# Patient Record
Sex: Female | Born: 1995
Health system: Southern US, Community
[De-identification: ages and names within clinical notes are randomized; demographics above are authoritative.]

## PROBLEM LIST (undated history)

## (undated) ENCOUNTER — Inpatient Hospital Stay (HOSPITAL_COMMUNITY): Payer: Self-pay

## (undated) DIAGNOSIS — K439 Ventral hernia without obstruction or gangrene: Secondary | ICD-10-CM

## (undated) DIAGNOSIS — D649 Anemia, unspecified: Secondary | ICD-10-CM

## (undated) DIAGNOSIS — R519 Headache, unspecified: Secondary | ICD-10-CM

## (undated) DIAGNOSIS — Z975 Presence of (intrauterine) contraceptive device: Secondary | ICD-10-CM

## (undated) HISTORY — PX: NO PAST SURGERIES: SHX2092

## (undated) HISTORY — DX: Presence of (intrauterine) contraceptive device: Z97.5

## (undated) HISTORY — DX: Headache, unspecified: R51.9

---
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
## 2016-06-19 ENCOUNTER — Encounter: Payer: Self-pay | Admitting: Family

## 2016-06-19 ENCOUNTER — Ambulatory Visit (INDEPENDENT_AMBULATORY_CARE_PROVIDER_SITE_OTHER): Payer: BLUE CROSS/BLUE SHIELD | Admitting: Family

## 2016-06-19 VITALS — BP 123/60 | HR 77 | Temp 98.4°F | Resp 18 | Ht 61.0 in | Wt 188.2 lb

## 2016-06-19 DIAGNOSIS — J209 Acute bronchitis, unspecified: Secondary | ICD-10-CM | POA: Diagnosis not present

## 2016-06-19 MED ORDER — BENZONATATE 100 MG PO CAPS: 100.0000 mg | ORAL_CAPSULE | Freq: Three times a day (TID) | ORAL | 0 refills | Status: DC | PRN

## 2016-06-19 MED ORDER — AZITHROMYCIN 250 MG PO TABS: ORAL_TABLET | ORAL | 0 refills | Status: DC

## 2016-06-19 NOTE — Patient Instructions (Signed)
Begin azithromycin (antibiotic) for bronchitis. Begin tessalon 3 times daily for cough. Please call if new/worsening symptoms or if symptoms are not improved in 2-3 days.

## 2016-06-19 NOTE — Progress Notes (Signed)
Subjective:    Patient ID: Claire Schmidt, female    DOB: 1996-03-06, 20 y.o.   MRN: [id number]  HPI  Claire Schmidt is a 20 yr old female who presents today with chief complaint of cough. Cough has been present x 1 week. Had fever on the first day of 101. No further fever since day one. Cough has worsened over the course of the week. Cough is productive sometimes but mainly dray.  Nyquil/dayquil do not help. Denies ear pain or sore throat (except with cough).  Claire Schmidt does report + associated headaches.   Review of Systems See HPI  No past medical history on file.   Social History   Social History  . Marital status: Single    Spouse name: N/A  . Number of children: N/A  . Years of education: N/A   Occupational History  . Not on file.   Social History Main Topics  . Smoking status: Passive Smoke Exposure - Never Smoker  . Smokeless tobacco: Not on file     Comment: Pt states no current smokers in household  . Alcohol use No  . Drug use: No  . Sexual activity: No   Other Topics Concern  . Not on file   Social History Narrative   Lives with mom and dad.  Middle of 3 girls. 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  Family History  Problem Relation Age of Onset  . Asthma Neg Hx   . Diabetes Neg Hx   . Heart disease Neg Hx   . Hyperlipidemia Neg Hx   . Hypertension Neg Hx   . Dementia Maternal Grandmother   . Heart attack Paternal Grandmother     Allergies  Allergen Reactions  . Bee Venom     Current Outpatient Prescriptions on File Prior to Visit  Medication Sig Dispense Refill  . EPINEPHrine 0.3 mg/0.3 mL IJ SOAJ injection Inject 0.3 mLs (0.3 mg total) into the muscle once. 2 Device 0  . etonogestrel (IMPLANON) 68 MG IMPL implant 1 each (68 mg total) by Subdermal route once. 1 each 0   No current facility-administered medications on  file prior to visit.     BP 123/60 (BP Location: Right Arm, Cuff Size: Large)   Pulse 77   Temp 98.4 F (36.9 C) (Oral)   Resp 18   Ht 5\' 1"  (1.549 m)   Wt 188 lb 3.2 oz (85.4 kg)   LMP 05/08/2016   SpO2 100% Comment: room air  BMI 35.56 kg/m       Objective:   Physical Exam  Constitutional: Claire Schmidt is oriented to person, place, and time. Claire Schmidt appears well-developed and well-nourished.  HENT:  Mouth/Throat: No oropharyngeal exudate, posterior oropharyngeal edema or posterior oropharyngeal erythema.  Bilateral cerumen impaction occludes TM's  Cardiovascular: Normal rate, regular rhythm and normal heart sounds.   No murmur heard. Pulmonary/Chest: Effort normal and breath sounds normal. No respiratory distress. Claire Schmidt has no wheezes.  Lymphadenopathy:    Claire Schmidt has no cervical adenopathy.  Neurological: Claire Schmidt is alert and oriented to person, place, and time.  Skin: Skin is warm and dry.  Psychiatric: Claire Schmidt has a normal mood and affect. Her behavior is normal. Judgment and thought content normal.          Assessment & Plan:  Bronchitis-  given duration and severity of cough will initiate zpak. Tessalon as needed for cough. Pt is advised as follows:   Please call if new/worsening symptoms or if symptoms are not improved in 2-3 days.

## 2016-06-19 NOTE — Progress Notes (Signed)
Pre visit review using our clinic review tool, if applicable. No additional management support is needed unless otherwise documented below in the visit note. 

## 2016-06-21 ENCOUNTER — Encounter: Payer: Self-pay | Admitting: Family

## 2016-06-22 NOTE — Telephone Encounter (Signed)
Letter placed at front desk for pick up

## 2016-06-25 ENCOUNTER — Ambulatory Visit: Payer: BLUE CROSS/BLUE SHIELD | Admitting: Family

## 2016-06-25 ENCOUNTER — Encounter: Payer: Self-pay | Admitting: Family

## 2016-06-25 ENCOUNTER — Ambulatory Visit (HOSPITAL_BASED_OUTPATIENT_CLINIC_OR_DEPARTMENT_OTHER)
Admission: RE | Admit: 2016-06-25 | Discharge: 2016-06-25 | Disposition: A | Discharge status: Home / Self Care | Payer: BLUE CROSS/BLUE SHIELD | Source: Ambulatory Visit | Attending: Family | Admitting: Family

## 2016-06-25 ENCOUNTER — Ambulatory Visit (INDEPENDENT_AMBULATORY_CARE_PROVIDER_SITE_OTHER): Payer: BLUE CROSS/BLUE SHIELD | Admitting: Family

## 2016-06-25 VITALS — BP 112/64 | HR 66 | Temp 98.4°F | Resp 18 | Ht 61.0 in | Wt 185.8 lb

## 2016-06-25 DIAGNOSIS — R091 Pleurisy: Secondary | ICD-10-CM | POA: Diagnosis not present

## 2016-06-25 DIAGNOSIS — R059 Cough, unspecified: Secondary | ICD-10-CM

## 2016-06-25 DIAGNOSIS — R05 Cough: Secondary | ICD-10-CM | POA: Diagnosis not present

## 2016-06-25 DIAGNOSIS — J029 Acute pharyngitis, unspecified: Secondary | ICD-10-CM

## 2016-06-25 LAB — POCT RAPID STREP A (OFFICE): RAPID STREP A SCREEN: NEGATIVE

## 2016-06-25 MED ORDER — FLUTICASONE PROPIONATE 50 MCG/ACT NA SUSP: 2.0000 | Freq: Every day | NASAL | 6 refills | Status: DC

## 2016-06-25 MED ORDER — ALBUTEROL SULFATE HFA 108 (90 BASE) MCG/ACT IN AERS: 2.0000 | INHALATION_SPRAY | Freq: Four times a day (QID) | RESPIRATORY_TRACT | 0 refills | Status: DC | PRN

## 2016-06-25 MED ORDER — LORATADINE 10 MG PO TABS: 10.0000 mg | ORAL_TABLET | Freq: Every day | ORAL | 5 refills | Status: DC

## 2016-06-25 NOTE — Patient Instructions (Addendum)
Please complete chest x ray on the first floor. For nasal congestion, please begin claritin 10 mg once daily and  flonase 2 sprays each nostril once daily. For chest discomfort with cough, please begin ibuprofen 400mg  every 6 hours as needed for the next few days. For wheezing, you may use albuterol 2 puffs every 6 hours.  Please call if new/worsening symptoms or if not improved in 1 week.

## 2016-06-25 NOTE — Addendum Note (Signed)
Addended by: Mervin KungFERGERSON, Kazoua Gossen A on: 06/25/2016 05:15 PM   Modules accepted: Orders

## 2016-06-25 NOTE — Progress Notes (Signed)
Subjective:    Patient ID: Claire Schmidt, female    DOB: 11/05/1995, 20 y.o.   MRN: [id number]  HPI  Claire Schmidt is a 20 yr old female who presents today for follow up of her bronchitis.  She was treated on 06/19/16 with zpak and tessalon. She reports that she continues to cough.  + pleuritic chest pain with deep breaths.  Denies fever.  Cough is generally dry, productive of greenish mucous at times. She reports occasional wheezing.    Denies pain/swelling in legs or recent car travel. She does have some intermittent sinus headaches and nasal drainage. Using vicks to help "open it back up."  Not using any antihistamines. + sneezing.     Review of Systems    see HPI  No past medical history on file.   Social History   Social History  . Marital status: Single    Spouse name: N/A  . Number of children: N/A  . Years of education: N/A   Occupational History  . Not on file.   Social History Main Topics  . Smoking status: Passive Smoke Exposure - Never Smoker  . Smokeless tobacco: Not on file     Comment: Pt states no current smokers in household  . Alcohol use No  . Drug use: No  . Sexual activity: No   Other Topics Concern  . Not on file   Social History Narrative   Lives with mom and dad.  Middle of 3 girls. 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  Family History  Problem Relation Age of Onset  . Asthma Neg Hx   . Diabetes Neg Hx   . Heart disease Neg Hx   . Hyperlipidemia Neg Hx   . Hypertension Neg Hx   . Dementia Maternal Grandmother   . Heart attack Paternal Grandmother     Allergies  Allergen Reactions  . Bee Venom     Current Outpatient Prescriptions on File Prior to Visit  Medication Sig Dispense Refill  . azithromycin (ZITHROMAX) 250 MG tablet 2 tabs by mouth today, then one tab once daily for 4 more days 6 tablet 0    . benzonatate (TESSALON) 100 MG capsule Take 1 capsule (100 mg total) by mouth 3 (three) times daily as needed. 20 capsule 0  . EPINEPHrine 0.3 mg/0.3 mL IJ SOAJ injection Inject 0.3 mLs (0.3 mg total) into the muscle once. 2 Device 0  . etonogestrel (IMPLANON) 68 MG IMPL implant 1 each (68 mg total) by Subdermal route once. 1 each 0   No current facility-administered medications on file prior to visit.     BP 112/64 (BP Location: Right Arm, Cuff Size: Large)   Pulse 66   Temp 98.4 F (36.9 C) (Oral)   Resp 18   Ht 5\' 1"  (1.549 m)   Wt 185 lb 12.8 oz (84.3 kg)   LMP 05/08/2016   SpO2 100% Comment: room air  BMI 35.11 kg/m    Objective:   Physical Exam  Constitutional: She is oriented to person, place, and time. She appears well-developed and well-nourished.  HENT:  Right Ear: Tympanic membrane and ear canal normal.  Left Ear: Tympanic membrane and ear canal normal.  Mouth/Throat: No oropharyngeal exudate, posterior oropharyngeal edema or posterior oropharyngeal erythema.  Bilateral TM's occluded by  cerumen  Eyes: Conjunctivae are normal.  Cardiovascular: Normal rate, regular rhythm and normal heart sounds.   No murmur heard. Pulmonary/Chest: Effort normal and breath sounds normal. No respiratory distress. She has no wheezes.  Lymphadenopathy:    She has no cervical adenopathy.  Neurological: She is alert and oriented to person, place, and time.  Skin: Skin is warm and dry.  Psychiatric: She has a normal mood and affect. Her behavior is normal. Judgment and thought content normal.          Assessment & Plan:  Sore throat-Rapid strep is negative.   Cough-  I think that this is worsened by allergies. Advised pt as follows:  Please complete chest x ray on the first floor. For nasal congestion, please begin claritin 10 mg once daily and  flonase 2 sprays each nostril once daily. For wheezing, you may use albuterol 2 puffs every 6 hours.   Pleurisy- For chest  discomfort with cough, please begin ibuprofen 400mg  every 6 hours as needed for the next few days. For wheezing, you may use albuterol 2 puffs every 6 hours.  Please call if new/worsening symptoms or if not improved in 1 week.

## 2016-06-25 NOTE — Progress Notes (Signed)
Pre visit review using our clinic review tool, if applicable. No additional management support is needed unless otherwise documented below in the visit note. 

## 2016-06-28 LAB — CULTURE, GROUP A STREP: Organism ID, Bacteria: NORMAL

## 2016-07-13 ENCOUNTER — Encounter: Payer: Self-pay | Admitting: Family

## 2016-07-13 ENCOUNTER — Ambulatory Visit (INDEPENDENT_AMBULATORY_CARE_PROVIDER_SITE_OTHER): Payer: BLUE CROSS/BLUE SHIELD | Admitting: Family

## 2016-07-13 ENCOUNTER — Ambulatory Visit (HOSPITAL_BASED_OUTPATIENT_CLINIC_OR_DEPARTMENT_OTHER)
Admission: RE | Admit: 2016-07-13 | Discharge: 2016-07-13 | Disposition: A | Discharge status: Home / Self Care | Payer: BLUE CROSS/BLUE SHIELD | Source: Ambulatory Visit | Attending: Family | Admitting: Family

## 2016-07-13 VITALS — BP 103/59 | Temp 98.3°F | Resp 16 | Ht 61.0 in | Wt 187.8 lb

## 2016-07-13 DIAGNOSIS — M549 Dorsalgia, unspecified: Secondary | ICD-10-CM | POA: Diagnosis not present

## 2016-07-13 DIAGNOSIS — M542 Cervicalgia: Secondary | ICD-10-CM | POA: Diagnosis not present

## 2016-07-13 LAB — POCT URINE HCG BY VISUAL COLOR COMPARISON TESTS: PREG TEST UR: NEGATIVE

## 2016-07-13 MED ORDER — MELOXICAM 7.5 MG PO TABS: 7.5000 mg | ORAL_TABLET | Freq: Every day | ORAL | 0 refills | Status: DC

## 2016-07-13 MED ORDER — CYCLOBENZAPRINE HCL 5 MG PO TABS: 5.0000 mg | ORAL_TABLET | Freq: Three times a day (TID) | ORAL | 0 refills | Status: DC | PRN

## 2016-07-13 NOTE — Progress Notes (Signed)
Pre visit review using our clinic review tool, if applicable. No additional management support is needed unless otherwise documented below in the visit note. 
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
## 2016-07-13 NOTE — Patient Instructions (Signed)
Please complete x rays on the first floor. Begin meloxicam once daily (anti-inflammatory) for pain. You may use flexeril (muscle relaxer) three times daily as needed- however, this may cause drowsiness so do not drive after taking.  Call if new/worsening symptoms or if symptoms are not improved in 1-2 weeks.

## 2016-07-13 NOTE — Addendum Note (Signed)
Addended by: Mervin KungFERGERSON, Javonta Gronau A on: 07/13/2016 01:15 PM   Modules accepted: Orders

## 2016-07-16 ENCOUNTER — Encounter: Payer: Self-pay | Admitting: Family

## 2016-07-18 ENCOUNTER — Encounter: Payer: Self-pay | Admitting: Family

## 2016-07-19 ENCOUNTER — Encounter: Payer: Self-pay | Admitting: Family

## 2016-07-23 ENCOUNTER — Encounter: Payer: Self-pay | Admitting: Family

## 2016-07-24 ENCOUNTER — Encounter: Payer: Self-pay | Admitting: Family

## 2016-12-18 DIAGNOSIS — Z3201 Encounter for pregnancy test, result positive: Secondary | ICD-10-CM | POA: Diagnosis not present

## 2017-01-15 DIAGNOSIS — Z3689 Encounter for other specified antenatal screening: Secondary | ICD-10-CM | POA: Diagnosis not present

## 2017-01-15 DIAGNOSIS — Z3401 Encounter for supervision of normal first pregnancy, first trimester: Secondary | ICD-10-CM | POA: Diagnosis not present

## 2017-01-15 LAB — OB RESULTS CONSOLE GC/CHLAMYDIA
Chlamydia: NEGATIVE
Gonorrhea: NEGATIVE

## 2017-01-15 LAB — OB RESULTS CONSOLE ANTIBODY SCREEN: Antibody Screen: NEGATIVE

## 2017-01-15 LAB — OB RESULTS CONSOLE RUBELLA ANTIBODY, IGM: RUBELLA: IMMUNE

## 2017-01-15 LAB — OB RESULTS CONSOLE ABO/RH: RH TYPE: POSITIVE

## 2017-01-15 LAB — OB RESULTS CONSOLE HEPATITIS B SURFACE ANTIGEN: HEP B S AG: NEGATIVE

## 2017-01-15 LAB — OB RESULTS CONSOLE RPR: RPR: NONREACTIVE

## 2017-01-15 LAB — OB RESULTS CONSOLE HIV ANTIBODY (ROUTINE TESTING): HIV: NONREACTIVE

## 2017-02-04 DIAGNOSIS — Z3401 Encounter for supervision of normal first pregnancy, first trimester: Secondary | ICD-10-CM | POA: Diagnosis not present

## 2017-02-04 DIAGNOSIS — Z3689 Encounter for other specified antenatal screening: Secondary | ICD-10-CM | POA: Diagnosis not present

## 2017-02-04 DIAGNOSIS — Z3A11 11 weeks gestation of pregnancy: Secondary | ICD-10-CM | POA: Diagnosis not present

## 2017-02-04 DIAGNOSIS — O365910 Maternal care for other known or suspected poor fetal growth, first trimester, not applicable or unspecified: Secondary | ICD-10-CM | POA: Diagnosis not present

## 2017-02-19 ENCOUNTER — Encounter (HOSPITAL_COMMUNITY): Payer: Self-pay | Admitting: Family Medicine

## 2017-02-19 ENCOUNTER — Ambulatory Visit (HOSPITAL_COMMUNITY)
Admission: EM | Admit: 2017-02-19 | Discharge: 2017-02-19 | Disposition: A | Discharge status: Nursing Home (Includes ICF) | Payer: BLUE CROSS/BLUE SHIELD | Attending: Internal Medicine | Admitting: Internal Medicine

## 2017-02-19 ENCOUNTER — Inpatient Hospital Stay (HOSPITAL_COMMUNITY)
Admission: AD | Admit: 2017-02-19 | Discharge: 2017-02-19 | Disposition: A | Discharge status: Home / Self Care | Payer: BLUE CROSS/BLUE SHIELD | Source: Ambulatory Visit | Attending: Obstetrics and Gynecology | Admitting: Obstetrics and Gynecology

## 2017-02-19 ENCOUNTER — Encounter (HOSPITAL_COMMUNITY): Payer: Self-pay | Admitting: *Deleted

## 2017-02-19 DIAGNOSIS — O26891 Other specified pregnancy related conditions, first trimester: Secondary | ICD-10-CM | POA: Diagnosis not present

## 2017-02-19 DIAGNOSIS — Z331 Pregnant state, incidental: Secondary | ICD-10-CM

## 2017-02-19 DIAGNOSIS — O9989 Other specified diseases and conditions complicating pregnancy, childbirth and the puerperium: Secondary | ICD-10-CM

## 2017-02-19 DIAGNOSIS — R197 Diarrhea, unspecified: Secondary | ICD-10-CM

## 2017-02-19 DIAGNOSIS — I951 Orthostatic hypotension: Secondary | ICD-10-CM

## 2017-02-19 DIAGNOSIS — Z7722 Contact with and (suspected) exposure to environmental tobacco smoke (acute) (chronic): Secondary | ICD-10-CM | POA: Diagnosis not present

## 2017-02-19 DIAGNOSIS — Z3A13 13 weeks gestation of pregnancy: Secondary | ICD-10-CM | POA: Diagnosis not present

## 2017-02-19 DIAGNOSIS — O219 Vomiting of pregnancy, unspecified: Secondary | ICD-10-CM | POA: Diagnosis not present

## 2017-02-19 DIAGNOSIS — K529 Noninfective gastroenteritis and colitis, unspecified: Secondary | ICD-10-CM | POA: Diagnosis not present

## 2017-02-19 DIAGNOSIS — R112 Nausea with vomiting, unspecified: Secondary | ICD-10-CM

## 2017-02-19 LAB — POCT I-STAT, CHEM 8
BUN: 6 mg/dL (ref 6–20)
CALCIUM ION: 1.21 mmol/L (ref 1.15–1.40)
CREATININE: 0.5 mg/dL (ref 0.44–1.00)
Chloride: 105 mmol/L (ref 101–111)
GLUCOSE: 97 mg/dL (ref 65–99)
HCT: 38 % (ref 36.0–46.0)
HEMOGLOBIN: 12.9 g/dL (ref 12.0–15.0)
Potassium: 3.9 mmol/L (ref 3.5–5.1)
Sodium: 137 mmol/L (ref 135–145)
TCO2: 24 mmol/L (ref 0–100)

## 2017-02-19 LAB — POCT URINALYSIS DIP (DEVICE)
Bilirubin Urine: NEGATIVE
GLUCOSE, UA: NEGATIVE mg/dL
Hgb urine dipstick: NEGATIVE
Ketones, ur: NEGATIVE mg/dL
Leukocytes, UA: NEGATIVE
Nitrite: NEGATIVE
PH: 8.5 — AB (ref 5.0–8.0)
PROTEIN: 30 mg/dL — AB
SPECIFIC GRAVITY, URINE: 1.02 (ref 1.005–1.030)
UROBILINOGEN UA: 0.2 mg/dL (ref 0.0–1.0)

## 2017-02-19 LAB — URINALYSIS, ROUTINE W REFLEX MICROSCOPIC
BILIRUBIN URINE: NEGATIVE
Glucose, UA: NEGATIVE mg/dL
HGB URINE DIPSTICK: NEGATIVE
KETONES UR: 20 mg/dL — AB
Leukocytes, UA: NEGATIVE
NITRITE: NEGATIVE
PH: 7 (ref 5.0–8.0)
Protein, ur: NEGATIVE mg/dL
Specific Gravity, Urine: 1.021 (ref 1.005–1.030)

## 2017-02-19 MED ORDER — ONDANSETRON HCL 4 MG PO TABS: 4.0000 mg | ORAL_TABLET | Freq: Three times a day (TID) | ORAL | 0 refills | Status: DC | PRN

## 2017-02-19 MED ORDER — SODIUM CHLORIDE 0.9 % IV SOLN
8.0000 mg | Freq: Once | INTRAVENOUS | Status: AC
  Administered 2017-02-19: 8 mg via INTRAVENOUS
  Filled 2017-02-19: qty 4

## 2017-02-19 MED ORDER — LACTATED RINGERS IV BOLUS (SEPSIS)
1000.0000 mL | Freq: Once | INTRAVENOUS | Status: AC
  Administered 2017-02-19: 1000 mL via INTRAVENOUS

## 2017-02-19 NOTE — ED Provider Notes (Signed)
CSN: [id number]     Arrival date & time 02/19/17  1127 History   First MD Initiated Contact with Patient 02/19/17 1231     Chief Complaint  Patient presents with  . Morning Sickness   (Consider location/radiation/quality/duration/timing/severity/associated sxs/prior Treatment) 21 year old female at [redacted] weeks gestation states she awoke this morning about 7:30 AM with vomiting which has been persistent since that time. She has had nausea, vomiting and diarrhea. Anytime she drinks or tends to eat something she has vomiting and associated diarrhea. Denies known fever, denies abdominal or pelvic pain or bleeding. On arrival she was retching and vomiting. She complains of lassitude and feeling bad in general.      History reviewed. No pertinent past medical history. History reviewed. No pertinent surgical history. Family History  Problem Relation Age of Onset  . Dementia Maternal Grandmother   . Heart attack Paternal Grandmother   . Asthma Neg Hx   . Diabetes Neg Hx   . Heart disease Neg Hx   . Hyperlipidemia Neg Hx   . Hypertension Neg Hx    Social History  Substance Use Topics  . Smoking status: Passive Smoke Exposure - Never Smoker  . Smokeless tobacco: Never Used     Comment: Pt states no current smokers in household  . Alcohol use No   OB History    Gravida Para Term Preterm AB Living   1             SAB TAB Ectopic Multiple Live Births                 Review of Systems  Constitutional: Positive for activity change and fatigue. Negative for fever.  HENT: Negative.   Respiratory: Negative for shortness of breath.   Cardiovascular: Negative for chest pain and leg swelling.  Gastrointestinal: Positive for diarrhea, nausea and vomiting. Negative for abdominal pain.  Genitourinary: Negative for dysuria, frequency, pelvic pain, vaginal bleeding and vaginal discharge.  Musculoskeletal: Negative.   Skin: Negative.     Allergies  Bee venom  Home Medications   Prior to  Admission medications   Medication Sig Start Date End Date Taking? Authorizing Provider  EPINEPHrine 0.3 mg/0.3 mL IJ SOAJ injection Inject 0.3 mLs (0.3 mg total) into the muscle once. 12/02/14   Sandford Craze, NP   Meds Ordered and Administered this Visit  Medications - No data to display  BP 117/76   Pulse 93   Temp 98.1 F (36.7 C)   Resp 18   LMP 06/29/2016   SpO2 100%  Orthostatic VS for the past 24 hrs:  BP- Lying Pulse- Lying BP- Sitting Pulse- Sitting BP- Standing at 0 minutes Pulse- Standing at 0 minutes  02/19/17 1218 113/71 79 112/76 88 107/63 96    Physical Exam  Constitutional: She is oriented to person, place, and time. She appears well-developed and well-nourished. No distress.  HENT:  Mouth/Throat: Oropharynx is clear and moist.  Eyes: EOM are normal.  Neck: Neck supple.  Cardiovascular: Normal rate, regular rhythm, normal heart sounds and intact distal pulses.   Pulmonary/Chest: Effort normal and breath sounds normal. No respiratory distress.  Abdominal: Soft. She exhibits no distension. There is no tenderness. There is no guarding.  Musculoskeletal: She exhibits no edema.  Neurological: She is alert and oriented to person, place, and time.  Skin: Skin is warm and dry.  Psychiatric: She has a normal mood and affect.  Nursing note and vitals reviewed.   Urgent Care Course  Procedures (including critical care time)  Labs Review Labs Reviewed  POCT URINALYSIS DIP (DEVICE) - Abnormal; Notable for the following:       Result Value   pH 8.5 (*)    Protein, ur 30 (*)    All other components within normal limits  POCT I-STAT, CHEM 8   Results for orders placed or performed during the hospital encounter of 02/19/17  I-STAT, chem 8  Result Value Ref Range   Sodium 137 135 - 145 mmol/L   Potassium 3.9 3.5 - 5.1 mmol/L   Chloride 105 101 - 111 mmol/L   BUN 6 6 - 20 mg/dL   Creatinine, Ser 1.610.50 0.44 - 1.00 mg/dL   Glucose, Bld 97 65 - 99 mg/dL    Calcium, Ion 0.961.21 0.451.15 - 1.40 mmol/L   TCO2 24 0 - 100 mmol/L   Hemoglobin 12.9 12.0 - 15.0 g/dL   HCT 40.938.0 81.136.0 - 91.446.0 %  POCT urinalysis dip (device)  Result Value Ref Range   Glucose, UA NEGATIVE NEGATIVE mg/dL   Bilirubin Urine NEGATIVE NEGATIVE   Ketones, ur NEGATIVE NEGATIVE mg/dL   Specific Gravity, Urine 1.020 1.005 - 1.030   Hgb urine dipstick NEGATIVE NEGATIVE   pH 8.5 (H) 5.0 - 8.0   Protein, ur 30 (A) NEGATIVE mg/dL   Urobilinogen, UA 0.2 0.0 - 1.0 mg/dL   Nitrite NEGATIVE NEGATIVE   Leukocytes, UA NEGATIVE NEGATIVE     Imaging Review No results found.   Visual Acuity Review  Right Eye Distance:   Left Eye Distance:   Bilateral Distance:    Right Eye Near:   Left Eye Near:    Bilateral Near:         MDM   1. [redacted] weeks gestation of pregnancy   2. Nausea vomiting and diarrhea   3. Orthostasis    To go to the emergency department now. [redacted] weeks gestation with vomiting and diarrhea and nausea. Mild orthostatic hypotension. May need additional evaluation in terms of lab work, fluids and appropriate medication for symptoms of nausea and vomiting, Not available at urgent care.     Hayden RasmussenMabe, Terin Cragle, NP 02/19/17 1343

## 2017-02-19 NOTE — Discharge Instructions (Signed)
Increase po fluid intake, ok for imodium

## 2017-02-19 NOTE — Discharge Instructions (Signed)
To go to the emergency department now. [redacted] weeks gestation with vomiting and diarrhea and nausea. Mild orthostatic hypotension. May need additional evaluation in terms of lab work, fluids and appropriate medication for symptoms of nausea and vomiting, Not available at urgent care.

## 2017-02-19 NOTE — MAU Note (Signed)
Pt presents to MAU with complaint of nausea, vomiting and diarrhea since this morning.

## 2017-02-19 NOTE — MAU Provider Note (Addendum)
History     Chief Complaint  Patient presents with  . Nausea  . Emesis  . Diarrhea   21 yo G1P0 BF @ 13 2/[redacted] week gestation presents with  C/o nausea, vomiting and diarrhea. No fever. No family member Ill nor change in diet  OB History    Gravida Para Term Preterm AB Living   1             SAB TAB Ectopic Multiple Live Births                  Past Medical History:  Diagnosis Date  . Medical history non-contributory     Past Surgical History:  Procedure Laterality Date  . NO PAST SURGERIES      Family History  Problem Relation Age of Onset  . Dementia Maternal Grandmother   . Heart attack Paternal Grandmother   . Asthma Neg Hx   . Diabetes Neg Hx   . Heart disease Neg Hx   . Hyperlipidemia Neg Hx   . Hypertension Neg Hx     Social History  Substance Use Topics  . Smoking status: Passive Smoke Exposure - Never Smoker  . Smokeless tobacco: Never Used     Comment: Pt states no current smokers in household  . Alcohol use No    Allergies:  Allergies  Allergen Reactions  . Bee Venom Anaphylaxis    Prescriptions Prior to Admission  Medication Sig Dispense Refill Last Dose  . Prenatal Vit-Fe Fumarate-FA (PRENATAL MULTIVITAMIN) TABS tablet Take 1 tablet by mouth daily at 12 noon.   02/18/2017 at Unknown time  . EPINEPHrine 0.3 mg/0.3 mL IJ SOAJ injection Inject 0.3 mLs (0.3 mg total) into the muscle once. (Patient taking differently: Inject 0.3 mg into the muscle as needed (anaphylaxis reaction). ) 2 Device 0 rescue     Physical Exam   Blood pressure (!) 102/59, pulse 78, temperature 98.4 F (36.9 C), resp. rate 18, height 5\' 4"  (1.626 m), weight 85.7 kg (189 lb), last menstrual period 06/29/2016.  General appearance: alert, cooperative and no distress Lungs: clear to auscultation bilaterally Heart: regular rate and rhythm, S1, S2 normal, no murmur, click, rub or gallop Extremities: no edema, redness or tenderness in the calves or thighs  (+) FHR ED Course   Gastroenteritis IUP @ 13 2/7 weeks P) IVF x 2 liter, Zofran 8 mg IV.  D/c home. Bland diet MDM   Serita KyleOUSINS,Vernell Townley A, MD 4:42 PM 02/19/2017

## 2017-02-19 NOTE — ED Triage Notes (Signed)
Pt here for vomiting and she is [redacted] weeks pregnant. sts also diarrhea.

## 2017-03-21 DIAGNOSIS — O365910 Maternal care for other known or suspected poor fetal growth, first trimester, not applicable or unspecified: Secondary | ICD-10-CM | POA: Diagnosis not present

## 2017-03-21 DIAGNOSIS — Z3A17 17 weeks gestation of pregnancy: Secondary | ICD-10-CM | POA: Diagnosis not present

## 2017-04-05 DIAGNOSIS — O365910 Maternal care for other known or suspected poor fetal growth, first trimester, not applicable or unspecified: Secondary | ICD-10-CM | POA: Diagnosis not present

## 2017-04-05 DIAGNOSIS — Z3A19 19 weeks gestation of pregnancy: Secondary | ICD-10-CM | POA: Diagnosis not present

## 2017-04-05 DIAGNOSIS — R8290 Unspecified abnormal findings in urine: Secondary | ICD-10-CM | POA: Diagnosis not present

## 2017-04-05 DIAGNOSIS — R8299 Other abnormal findings in urine: Secondary | ICD-10-CM | POA: Diagnosis not present

## 2017-04-05 DIAGNOSIS — N898 Other specified noninflammatory disorders of vagina: Secondary | ICD-10-CM | POA: Diagnosis not present

## 2017-04-05 DIAGNOSIS — Z363 Encounter for antenatal screening for malformations: Secondary | ICD-10-CM | POA: Diagnosis not present

## 2017-05-03 DIAGNOSIS — Z3A23 23 weeks gestation of pregnancy: Secondary | ICD-10-CM | POA: Diagnosis not present

## 2017-05-03 DIAGNOSIS — O365910 Maternal care for other known or suspected poor fetal growth, first trimester, not applicable or unspecified: Secondary | ICD-10-CM | POA: Diagnosis not present

## 2017-05-04 ENCOUNTER — Encounter (HOSPITAL_COMMUNITY): Payer: Self-pay | Admitting: *Deleted

## 2017-05-04 ENCOUNTER — Inpatient Hospital Stay (HOSPITAL_COMMUNITY)
Admission: AD | Admit: 2017-05-04 | Discharge: 2017-05-04 | Disposition: A | Discharge status: Home / Self Care | Payer: BLUE CROSS/BLUE SHIELD | Source: Ambulatory Visit | Attending: Obstetrics and Gynecology | Admitting: Obstetrics and Gynecology

## 2017-05-04 DIAGNOSIS — R109 Unspecified abdominal pain: Secondary | ICD-10-CM | POA: Insufficient documentation

## 2017-05-04 DIAGNOSIS — Z7722 Contact with and (suspected) exposure to environmental tobacco smoke (acute) (chronic): Secondary | ICD-10-CM | POA: Insufficient documentation

## 2017-05-04 DIAGNOSIS — Z79899 Other long term (current) drug therapy: Secondary | ICD-10-CM | POA: Diagnosis not present

## 2017-05-04 DIAGNOSIS — R1084 Generalized abdominal pain: Secondary | ICD-10-CM | POA: Diagnosis not present

## 2017-05-04 DIAGNOSIS — O26892 Other specified pregnancy related conditions, second trimester: Secondary | ICD-10-CM | POA: Diagnosis not present

## 2017-05-04 DIAGNOSIS — Z3A23 23 weeks gestation of pregnancy: Secondary | ICD-10-CM | POA: Insufficient documentation

## 2017-05-04 LAB — URINALYSIS, ROUTINE W REFLEX MICROSCOPIC
BILIRUBIN URINE: NEGATIVE
GLUCOSE, UA: NEGATIVE mg/dL
Hgb urine dipstick: NEGATIVE
KETONES UR: NEGATIVE mg/dL
Leukocytes, UA: NEGATIVE
NITRITE: NEGATIVE
PH: 5 (ref 5.0–8.0)
Protein, ur: 30 mg/dL — AB
Specific Gravity, Urine: 1.028 (ref 1.005–1.030)

## 2017-05-04 NOTE — MAU Note (Signed)
Stomach pain started around 1300 today.  Pain is sharp and radiates around to lower back.

## 2017-05-04 NOTE — MAU Note (Signed)
Urine in lab 

## 2017-05-04 NOTE — MAU Provider Note (Signed)
History     Chief Complaint  Patient presents with  . Abdominal Pain    21 yo G1P0 SBF @ 23 6/[redacted] weeks gestation presents with complaint of abdominal pain which has now resolved. (+) FM denies urinary sx or leaking fluid  OB History    Gravida Para Term Preterm AB Living   1             SAB TAB Ectopic Multiple Live Births                  Past Medical History:  Diagnosis Date  . Medical history non-contributory     Past Surgical History:  Procedure Laterality Date  . NO PAST SURGERIES      Family History  Problem Relation Age of Onset  . Dementia Maternal Grandmother   . Heart attack Paternal Grandmother   . Asthma Neg Hx   . Diabetes Neg Hx   . Heart disease Neg Hx   . Hyperlipidemia Neg Hx   . Hypertension Neg Hx     Social History  Substance Use Topics  . Smoking status: Passive Smoke Exposure - Never Smoker  . Smokeless tobacco: Never Used     Comment: Pt states no current smokers in household  . Alcohol use No    Allergies:  Allergies  Allergen Reactions  . Bee Venom Anaphylaxis    Prescriptions Prior to Admission  Medication Sig Dispense Refill Last Dose  . EPINEPHrine 0.3 mg/0.3 mL IJ SOAJ injection Inject 0.3 mLs (0.3 mg total) into the muscle once. (Patient taking differently: Inject 0.3 mg into the muscle as needed (anaphylaxis reaction). ) 2 Device 0 rescue  . ondansetron (ZOFRAN) 4 MG tablet Take 1 tablet (4 mg total) by mouth every 8 (eight) hours as needed for nausea or vomiting. 30 tablet 0   . Prenatal Vit-Fe Fumarate-FA (PRENATAL MULTIVITAMIN) TABS tablet Take 1 tablet by mouth daily at 12 noon.   02/18/2017 at Unknown time     Physical Exam   Blood pressure 112/68, pulse 96, temperature 98 F (36.7 C), temperature source Oral, resp. rate 18, weight 89.8 kg (198 lb), last menstrual period 06/29/2016, SpO2 99 %.  General appearance: alert, cooperative and no distress Abdomen: gravid nontender Pelvic: external genitalia normal, no  bladder tenderness and cervix: long closed PP oop Extremities: no edema, redness or tenderness in the calves or thighs   Tracing (+) FHR No ctx ED Course  Abdominal pain in pregnancy IUP @ 23 6/8 week P) u/a, ucx, d/c home MDM   Dmitri Pettigrew A, MD 6:33 PM 05/04/2017

## 2017-05-04 NOTE — Discharge Instructions (Signed)
Cranberry juice. Increase po fluid intake. Call Monday for ucx result

## 2017-05-06 LAB — URINE CULTURE

## 2017-05-31 DIAGNOSIS — Z3689 Encounter for other specified antenatal screening: Secondary | ICD-10-CM | POA: Diagnosis not present

## 2017-05-31 DIAGNOSIS — Z3A27 27 weeks gestation of pregnancy: Secondary | ICD-10-CM | POA: Diagnosis not present

## 2017-05-31 DIAGNOSIS — O365910 Maternal care for other known or suspected poor fetal growth, first trimester, not applicable or unspecified: Secondary | ICD-10-CM | POA: Diagnosis not present

## 2017-06-20 DIAGNOSIS — O365910 Maternal care for other known or suspected poor fetal growth, first trimester, not applicable or unspecified: Secondary | ICD-10-CM | POA: Diagnosis not present

## 2017-06-20 DIAGNOSIS — Z3A30 30 weeks gestation of pregnancy: Secondary | ICD-10-CM | POA: Diagnosis not present

## 2017-06-20 DIAGNOSIS — Z3689 Encounter for other specified antenatal screening: Secondary | ICD-10-CM | POA: Diagnosis not present

## 2017-07-05 DIAGNOSIS — Z23 Encounter for immunization: Secondary | ICD-10-CM | POA: Diagnosis not present

## 2017-07-05 DIAGNOSIS — Z3A32 32 weeks gestation of pregnancy: Secondary | ICD-10-CM | POA: Diagnosis not present

## 2017-07-05 DIAGNOSIS — O365910 Maternal care for other known or suspected poor fetal growth, first trimester, not applicable or unspecified: Secondary | ICD-10-CM | POA: Diagnosis not present

## 2017-07-19 DIAGNOSIS — O3663X0 Maternal care for excessive fetal growth, third trimester, not applicable or unspecified: Secondary | ICD-10-CM | POA: Diagnosis not present

## 2017-07-19 DIAGNOSIS — Z3A34 34 weeks gestation of pregnancy: Secondary | ICD-10-CM | POA: Diagnosis not present

## 2017-07-19 DIAGNOSIS — Z23 Encounter for immunization: Secondary | ICD-10-CM | POA: Diagnosis not present

## 2017-08-02 DIAGNOSIS — Z3685 Encounter for antenatal screening for Streptococcus B: Secondary | ICD-10-CM | POA: Diagnosis not present

## 2017-08-02 DIAGNOSIS — Z3A36 36 weeks gestation of pregnancy: Secondary | ICD-10-CM | POA: Diagnosis not present

## 2017-08-02 DIAGNOSIS — O3663X0 Maternal care for excessive fetal growth, third trimester, not applicable or unspecified: Secondary | ICD-10-CM | POA: Diagnosis not present

## 2017-08-02 DIAGNOSIS — Z118 Encounter for screening for other infectious and parasitic diseases: Secondary | ICD-10-CM | POA: Diagnosis not present

## 2017-08-02 LAB — OB RESULTS CONSOLE GBS: STREP GROUP B AG: POSITIVE

## 2017-08-07 DIAGNOSIS — Z3A37 37 weeks gestation of pregnancy: Secondary | ICD-10-CM | POA: Diagnosis not present

## 2017-08-07 DIAGNOSIS — O3663X0 Maternal care for excessive fetal growth, third trimester, not applicable or unspecified: Secondary | ICD-10-CM | POA: Diagnosis not present

## 2017-08-15 ENCOUNTER — Encounter (HOSPITAL_COMMUNITY): Payer: Self-pay | Admitting: *Deleted

## 2017-08-15 ENCOUNTER — Inpatient Hospital Stay (HOSPITAL_COMMUNITY)
Admission: AD | Admit: 2017-08-15 | Discharge: 2017-08-15 | Disposition: A | Discharge status: Home / Self Care | Payer: BLUE CROSS/BLUE SHIELD | Source: Ambulatory Visit | Attending: Obstetrics and Gynecology | Admitting: Obstetrics and Gynecology

## 2017-08-15 DIAGNOSIS — O133 Gestational [pregnancy-induced] hypertension without significant proteinuria, third trimester: Secondary | ICD-10-CM | POA: Insufficient documentation

## 2017-08-15 DIAGNOSIS — Z3A38 38 weeks gestation of pregnancy: Secondary | ICD-10-CM | POA: Diagnosis not present

## 2017-08-15 DIAGNOSIS — O26893 Other specified pregnancy related conditions, third trimester: Secondary | ICD-10-CM

## 2017-08-15 DIAGNOSIS — R51 Headache: Secondary | ICD-10-CM

## 2017-08-15 DIAGNOSIS — Z7722 Contact with and (suspected) exposure to environmental tobacco smoke (acute) (chronic): Secondary | ICD-10-CM | POA: Diagnosis not present

## 2017-08-15 DIAGNOSIS — R030 Elevated blood-pressure reading, without diagnosis of hypertension: Secondary | ICD-10-CM | POA: Diagnosis not present

## 2017-08-15 DIAGNOSIS — O3663X0 Maternal care for excessive fetal growth, third trimester, not applicable or unspecified: Secondary | ICD-10-CM | POA: Diagnosis not present

## 2017-08-15 LAB — COMPREHENSIVE METABOLIC PANEL
ALK PHOS: 162 U/L — AB (ref 38–126)
ALT: 19 U/L (ref 14–54)
AST: 24 U/L (ref 15–41)
Albumin: 2.7 g/dL — ABNORMAL LOW (ref 3.5–5.0)
Anion gap: 9 (ref 5–15)
BILIRUBIN TOTAL: 0.4 mg/dL (ref 0.3–1.2)
BUN: 8 mg/dL (ref 6–20)
CALCIUM: 8.7 mg/dL — AB (ref 8.9–10.3)
CO2: 18 mmol/L — ABNORMAL LOW (ref 22–32)
CREATININE: 0.64 mg/dL (ref 0.44–1.00)
Chloride: 107 mmol/L (ref 101–111)
GFR calc Af Amer: >60 mL/min (ref >60)
GLUCOSE: 140 mg/dL — AB (ref 65–99)
POTASSIUM: 3.6 mmol/L (ref 3.5–5.1)
Sodium: 134 mmol/L — ABNORMAL LOW (ref 135–145)
TOTAL PROTEIN: 6.4 g/dL — AB (ref 6.5–8.1)

## 2017-08-15 LAB — LACTATE DEHYDROGENASE: LDH: 126 U/L (ref 98–192)

## 2017-08-15 LAB — CBC
HEMATOCRIT: 34.3 % — AB (ref 36.0–46.0)
HEMOGLOBIN: 11.6 g/dL — AB (ref 12.0–15.0)
MCH: 28 pg (ref 26.0–34.0)
MCHC: 33.8 g/dL (ref 30.0–36.0)
MCV: 82.9 fL (ref 78.0–100.0)
Platelets: 319 10*3/uL (ref 150–400)
RBC: 4.14 MIL/uL (ref 3.87–5.11)
RDW: 14 % (ref 11.5–15.5)
WBC: 10.8 10*3/uL — AB (ref 4.0–10.5)

## 2017-08-15 LAB — PROTEIN / CREATININE RATIO, URINE
Creatinine, Urine: 176 mg/dL
Protein Creatinine Ratio: 0.14 mg/mg{Cre} (ref 0.00–0.15)
Total Protein, Urine: 24 mg/dL

## 2017-08-15 LAB — URIC ACID: URIC ACID, SERUM: 5.4 mg/dL (ref 2.3–6.6)

## 2017-08-15 NOTE — MAU Note (Signed)
Pt in office today had elevated b/p and c/o headache and heartburn. Reports good fetal movement

## 2017-08-15 NOTE — MAU Note (Signed)
Notified Dr. Cherly Hensenousins of remaining lab results PCR and NST. OK to Discharge home

## 2017-08-15 NOTE — MAU Provider Note (Signed)
History     Chief Complaint  Patient presents with  . Hypertension  . Headache   21 yo G1P0  SBF @ 38 4/[redacted] weeks gestation [facility] Pt was seen in the office for routine care with c/o persistent headache and epigastric pain. Denies visual changes, blurry vision or hx migraine. (+) FM . Sl elevated BP at office  OB History    Gravida Para Term Preterm AB Living   1             SAB TAB Ectopic Multiple Live Births                  Past Medical History:  Diagnosis Date  . Medical history non-contributory     Past Surgical History:  Procedure Laterality Date  . NO PAST SURGERIES      Family History  Problem Relation Age of Onset  . Dementia Maternal Grandmother   . Heart attack Paternal Grandmother   . Asthma Neg Hx   . Diabetes Neg Hx   . Heart disease Neg Hx   . Hyperlipidemia Neg Hx   . Hypertension Neg Hx     Social History   Tobacco Use  . Smoking status: Passive Smoke Exposure - Never Smoker  . Smokeless tobacco: Never Used  . Tobacco comment: Pt states no current smokers in household  Substance Use Topics  . Alcohol use: No    Alcohol/week: 0.0 oz  . Drug use: No    Allergies:  Allergies  Allergen Reactions  . Bee Venom Anaphylaxis    Medications Prior to Admission  Medication Sig Dispense Refill Last Dose  . EPINEPHrine 0.3 mg/0.3 mL IJ SOAJ injection Inject 0.3 mLs (0.3 mg total) into the muscle once. (Patient taking differently: Inject 0.3 mg into the muscle as needed (anaphylaxis reaction). ) 2 Device 0 rescue  . Prenatal Vit-Fe Fumarate-FA (PRENATAL MULTIVITAMIN) TABS tablet Take 1 tablet by mouth daily at 12 noon.   02/18/2017 at Unknown time     Physical Exam   Blood pressure 118/68, pulse (!) 102, temperature 98.8 F (37.1 C), resp. rate 18, height 5' (1.524 m), weight 98.9 kg (218 lb), last menstrual period 06/29/2016.  No exam performed today, done in office.  Tracing: baseline  ED Course  IMP:  Headache in  Pregnancy Transient hypertension IUP @ P) PIH labs. Declines med for h/a. Instructed on using zantac 150 mg po bid MDM   Makaiya Geerdes A, MD 1:32 PM 08/15/2017   Addendum: CBC    Component Value Date/Time   WBC 10.8 (H) 08/15/2017 1305   RBC 4.14 08/15/2017 1305   HGB 11.6 (L) 08/15/2017 1305   HCT 34.3 (L) 08/15/2017 1305   PLT 319 08/15/2017 1305   MCV 82.9 08/15/2017 1305   MCH 28.0 08/15/2017 1305   MCHC 33.8 08/15/2017 1305   RDW 14.0 08/15/2017 1305   LYMPHSABS 2.8 08/22/2015 0825   MONOABS 0.7 08/22/2015 0825   EOSABS 0.3 08/22/2015 0825   BASOSABS 0.0 08/22/2015 0825   CMP Latest Ref Rng & Units 08/15/2017 02/19/2017 08/22/2015  Glucose 65 - 99 mg/dL 960(A140(H) 97 80  BUN 6 - 20 mg/dL 8 6 10   Creatinine 0.44 - 1.00 mg/dL 5.400.64 9.810.50 1.910.79  Sodium 135 - 145 mmol/L 134(L) 137 139  Potassium 3.5 - 5.1 mmol/L 3.6 3.9 3.7  Chloride 101 - 111 mmol/L 107 105 107  CO2 22 - 32 mmol/L 18(L) - 25  Calcium 8.9 -  10.3 mg/dL 1.6(X8.7(L) - 9.2  Total Protein 6.5 - 8.1 g/dL 6.4(L) - 6.4  Total Bilirubin 0.3 - 1.2 mg/dL 0.4 - 0.6  Alkaline Phos 38 - 126 U/L 162(H) - 51  AST 15 - 41 U/L 24 - 18  ALT 14 - 54 U/L 19 - 19  urine protein/creatinine ratio: 0.14 D/c home. Keep sched ob appts, preeclampsia warning signs

## 2017-08-22 ENCOUNTER — Encounter (HOSPITAL_COMMUNITY): Payer: Self-pay | Admitting: *Deleted

## 2017-08-22 ENCOUNTER — Telehealth (HOSPITAL_COMMUNITY): Payer: Self-pay | Admitting: *Deleted

## 2017-08-22 DIAGNOSIS — Z3A39 39 weeks gestation of pregnancy: Secondary | ICD-10-CM | POA: Diagnosis not present

## 2017-08-22 DIAGNOSIS — O3663X0 Maternal care for excessive fetal growth, third trimester, not applicable or unspecified: Secondary | ICD-10-CM | POA: Diagnosis not present

## 2017-08-22 NOTE — Telephone Encounter (Signed)
Preadmission screen  

## 2017-08-23 ENCOUNTER — Other Ambulatory Visit: Payer: Self-pay | Admitting: Obstetrics and Gynecology

## 2017-08-25 ENCOUNTER — Encounter (HOSPITAL_COMMUNITY): Payer: Self-pay | Admitting: Anesthesiology

## 2017-08-25 ENCOUNTER — Inpatient Hospital Stay (HOSPITAL_COMMUNITY): Payer: BLUE CROSS/BLUE SHIELD | Admitting: Anesthesiology

## 2017-08-25 ENCOUNTER — Other Ambulatory Visit: Payer: Self-pay

## 2017-08-25 ENCOUNTER — Encounter (HOSPITAL_COMMUNITY): Payer: Self-pay | Admitting: Certified Registered Nurse Anesthetist

## 2017-08-25 ENCOUNTER — Inpatient Hospital Stay (HOSPITAL_COMMUNITY)
Admission: RE | Admit: 2017-08-25 | Discharge: 2017-08-28 | DRG: 787 | Disposition: A | Discharge status: Home / Self Care | Payer: BLUE CROSS/BLUE SHIELD | Source: Ambulatory Visit | Attending: Obstetrics and Gynecology | Admitting: Obstetrics and Gynecology

## 2017-08-25 ENCOUNTER — Encounter (HOSPITAL_COMMUNITY): Payer: Self-pay

## 2017-08-25 DIAGNOSIS — O324XX0 Maternal care for high head at term, not applicable or unspecified: Principal | ICD-10-CM | POA: Diagnosis present

## 2017-08-25 DIAGNOSIS — Z3490 Encounter for supervision of normal pregnancy, unspecified, unspecified trimester: Secondary | ICD-10-CM | POA: Diagnosis present

## 2017-08-25 DIAGNOSIS — Z98891 History of uterine scar from previous surgery: Secondary | ICD-10-CM

## 2017-08-25 DIAGNOSIS — O99824 Streptococcus B carrier state complicating childbirth: Secondary | ICD-10-CM | POA: Diagnosis not present

## 2017-08-25 DIAGNOSIS — O9081 Anemia of the puerperium: Secondary | ICD-10-CM | POA: Diagnosis not present

## 2017-08-25 DIAGNOSIS — Z3483 Encounter for supervision of other normal pregnancy, third trimester: Secondary | ICD-10-CM | POA: Diagnosis not present

## 2017-08-25 DIAGNOSIS — Z3A40 40 weeks gestation of pregnancy: Secondary | ICD-10-CM

## 2017-08-25 DIAGNOSIS — O99214 Obesity complicating childbirth: Secondary | ICD-10-CM | POA: Diagnosis present

## 2017-08-25 DIAGNOSIS — D62 Acute posthemorrhagic anemia: Secondary | ICD-10-CM | POA: Diagnosis not present

## 2017-08-25 DIAGNOSIS — Z3A00 Weeks of gestation of pregnancy not specified: Secondary | ICD-10-CM | POA: Diagnosis not present

## 2017-08-25 DIAGNOSIS — O328XX0 Maternal care for other malpresentation of fetus, not applicable or unspecified: Secondary | ICD-10-CM | POA: Diagnosis not present

## 2017-08-25 LAB — ABO/RH: ABO/RH(D): B POS

## 2017-08-25 LAB — TYPE AND SCREEN
ABO/RH(D): B POS
Antibody Screen: NEGATIVE

## 2017-08-25 LAB — CBC
HCT: 35.1 % — ABNORMAL LOW (ref 36.0–46.0)
Hemoglobin: 11.4 g/dL — ABNORMAL LOW (ref 12.0–15.0)
MCH: 27.6 pg (ref 26.0–34.0)
MCHC: 32.5 g/dL (ref 30.0–36.0)
MCV: 85 fL (ref 78.0–100.0)
PLATELETS: 333 10*3/uL (ref 150–400)
RBC: 4.13 MIL/uL (ref 3.87–5.11)
RDW: 14.1 % (ref 11.5–15.5)
WBC: 12.2 10*3/uL — ABNORMAL HIGH (ref 4.0–10.5)

## 2017-08-25 MED ORDER — EPHEDRINE 5 MG/ML INJ: 10.0000 mg | INTRAVENOUS | Status: DC | PRN

## 2017-08-25 MED ORDER — LACTATED RINGERS IV SOLN
500.0000 mL | Freq: Once | INTRAVENOUS | Status: AC
  Administered 2017-08-25: 500 mL via INTRAVENOUS

## 2017-08-25 MED ORDER — OXYTOCIN 40 UNITS IN LACTATED RINGERS INFUSION - SIMPLE MED: 2.5000 [IU]/h | INTRAVENOUS | Status: DC

## 2017-08-25 MED ORDER — BUTORPHANOL TARTRATE 2 MG/ML IJ SOLN
2.0000 mg | INTRAMUSCULAR | Status: DC | PRN
  Administered 2017-08-25 (×2): 2 mg via INTRAVENOUS
  Filled 2017-08-25 (×3): qty 2

## 2017-08-25 MED ORDER — PENICILLIN G POTASSIUM 5000000 UNITS IJ SOLR
5.0000 10*6.[IU] | Freq: Once | INTRAVENOUS | Status: AC
  Administered 2017-08-25: 5 10*6.[IU] via INTRAVENOUS
  Filled 2017-08-25: qty 5

## 2017-08-25 MED ORDER — LIDOCAINE HCL (PF) 1 % IJ SOLN
30.0000 mL | INTRAMUSCULAR | Status: DC | PRN
  Filled 2017-08-25: qty 30

## 2017-08-25 MED ORDER — OXYTOCIN BOLUS FROM INFUSION: 500.0000 mL | Freq: Once | INTRAVENOUS | Status: DC

## 2017-08-25 MED ORDER — SOD CITRATE-CITRIC ACID 500-334 MG/5ML PO SOLN
30.0000 mL | ORAL | Status: DC | PRN
  Administered 2017-08-26: 30 mL via ORAL
  Filled 2017-08-25: qty 15

## 2017-08-25 MED ORDER — DIPHENHYDRAMINE HCL 50 MG/ML IJ SOLN: 12.5000 mg | INTRAMUSCULAR | Status: DC | PRN

## 2017-08-25 MED ORDER — LACTATED RINGERS IV SOLN
INTRAVENOUS | Status: DC
  Administered 2017-08-25: 23:00:00 via INTRAVENOUS
  Administered 2017-08-25: 125 mL/h via INTRAVENOUS
  Administered 2017-08-26: 1000 mL via INTRAVENOUS
  Administered 2017-08-26 (×2): via INTRAVENOUS

## 2017-08-25 MED ORDER — PHENYLEPHRINE 40 MCG/ML (10ML) SYRINGE FOR IV PUSH (FOR BLOOD PRESSURE SUPPORT)
80.0000 ug | PREFILLED_SYRINGE | INTRAVENOUS | Status: DC | PRN
  Filled 2017-08-25: qty 10

## 2017-08-25 MED ORDER — PENICILLIN G POT IN DEXTROSE 60000 UNIT/ML IV SOLN
3.0000 10*6.[IU] | INTRAVENOUS | Status: DC
  Administered 2017-08-25 – 2017-08-26 (×5): 3 10*6.[IU] via INTRAVENOUS
  Filled 2017-08-25 (×7): qty 50

## 2017-08-25 MED ORDER — ONDANSETRON HCL 4 MG/2ML IJ SOLN
4.0000 mg | Freq: Four times a day (QID) | INTRAMUSCULAR | Status: DC | PRN
  Administered 2017-08-25 – 2017-08-26 (×3): 4 mg via INTRAVENOUS
  Filled 2017-08-25: qty 2

## 2017-08-25 MED ORDER — ACETAMINOPHEN 325 MG PO TABS: 650.0000 mg | ORAL_TABLET | ORAL | Status: DC | PRN

## 2017-08-25 MED ORDER — OXYTOCIN 40 UNITS IN LACTATED RINGERS INFUSION - SIMPLE MED
1.0000 m[IU]/min | INTRAVENOUS | Status: DC
  Administered 2017-08-25: 2 m[IU]/min via INTRAVENOUS
  Filled 2017-08-25: qty 1000

## 2017-08-25 MED ORDER — FENTANYL 2.5 MCG/ML BUPIVACAINE 1/10 % EPIDURAL INFUSION (WH - ANES)
14.0000 mL/h | INTRAMUSCULAR | Status: DC | PRN
  Administered 2017-08-25 – 2017-08-26 (×2): 14 mL/h via EPIDURAL
  Filled 2017-08-25 (×2): qty 100

## 2017-08-25 MED ORDER — LACTATED RINGERS IV SOLN: 500.0000 mL | INTRAVENOUS | Status: DC | PRN

## 2017-08-25 MED ORDER — PHENYLEPHRINE 40 MCG/ML (10ML) SYRINGE FOR IV PUSH (FOR BLOOD PRESSURE SUPPORT): 80.0000 ug | PREFILLED_SYRINGE | INTRAVENOUS | Status: DC | PRN

## 2017-08-25 MED ORDER — TERBUTALINE SULFATE 1 MG/ML IJ SOLN: 0.2500 mg | Freq: Once | INTRAMUSCULAR | Status: DC | PRN

## 2017-08-25 MED ORDER — LIDOCAINE HCL (PF) 1 % IJ SOLN
INTRAMUSCULAR | Status: DC | PRN
  Administered 2017-08-25 (×2): 7 mL via EPIDURAL

## 2017-08-25 NOTE — Anesthesia Pain Management Evaluation Note (Signed)
  CRNA Pain Management Visit Note  Patient: Claire Schmidt, 21 y.o., female  "Hello I am a [facility]. We have an anesthesia team available at all times to provide care throughout the hospital, including epidural management and anesthesia for C-section. I don't know your plan for the delivery whether it a natural birth, water birth, IV sedation, nitrous supplementation, doula or epidural, but we want to meet your pain goals."   1.Was your pain managed to your expectations on prior hospitalizations?   No prior hospitalizations  2.What is your expectation for pain management during this hospitalization?     IV pain meds  3.How can we help you reach that goal? Support prn  Record the patient's initial score and the patient's pain goal.   Pain: 2  Pain Goal: 7 [facility] wants you to be able to say your pain was always managed very well.  Nakeesha Bowler Lacretia NicksW Loews CorporationFlowers Jr 08/25/2017

## 2017-08-25 NOTE — Anesthesia Procedure Notes (Signed)
Epidural Patient location during procedure: OB Start time: 08/25/2017 9:45 PM End time: 08/25/2017 9:50 PM  Staffing Anesthesiologist: Leilani AbleHatchett, Mike Hamre, MD Performed: anesthesiologist   Preanesthetic Checklist Completed: patient identified, site marked, surgical consent, pre-op evaluation, timeout performed, IV checked, risks and benefits discussed and monitors and equipment checked  Epidural Patient position: sitting Prep: site prepped and draped and DuraPrep Patient monitoring: continuous pulse ox and blood pressure Approach: midline Location: L3-L4 Injection technique: LOR air  Needle:  Needle type: Tuohy  Needle gauge: 17 G Needle length: 9 cm and 9 Needle insertion depth: 9 cm Catheter type: closed end flexible Catheter size: 19 Gauge Catheter at skin depth: 14 cm Test dose: negative and Other  Assessment Sensory level: T9 Events: blood not aspirated, injection not painful, no injection resistance, negative IV test and paresthesia  Additional Notes L leg X 4 on insertion of the catheterReason for block:procedure for pain

## 2017-08-25 NOTE — Anesthesia Preprocedure Evaluation (Signed)
Anesthesia Evaluation  Patient identified by MRN, date of birth, ID band Patient awake    Reviewed: Allergy & Precautions, H&P , NPO status , Patient's Chart, lab work & pertinent test results  Airway Mallampati: II  TM Distance: >3 FB Neck ROM: full    Dental no notable dental hx. (+) Teeth Intact   Pulmonary neg pulmonary ROS,    Pulmonary exam normal breath sounds clear to auscultation       Cardiovascular negative cardio ROS Normal cardiovascular exam Rhythm:regular Rate:Normal     Neuro/Psych negative psych ROS   GI/Hepatic negative GI ROS, Neg liver ROS,   Endo/Other  Morbid obesity  Renal/GU negative Renal ROS     Musculoskeletal   Abdominal (+) + obese,   Peds  Hematology  (+) anemia ,   Anesthesia Other Findings   Reproductive/Obstetrics (+) Pregnancy                             Anesthesia Physical Anesthesia Plan  ASA: III  Anesthesia Plan: Epidural   Post-op Pain Management:    Induction:   PONV Risk Score and Plan:   Airway Management Planned:   Additional Equipment:   Intra-op Plan:   Post-operative Plan:   Informed Consent: I have reviewed the patients History and Physical, chart, labs and discussed the procedure including the risks, benefits and alternatives for the proposed anesthesia with the patient or authorized representative who has indicated his/her understanding and acceptance.     Plan Discussed with:   Anesthesia Plan Comments:         Anesthesia Quick Evaluation

## 2017-08-25 NOTE — H&P (Signed)
Claire Schmidt is a 21 y.o. female presenting for IOL @ term. GBS cx (+)  OB History    Gravida Para Term Preterm AB Living   1             SAB TAB Ectopic Multiple Live Births                 Past Medical History:  Diagnosis Date  . Headache   . Medical history non-contributory    Past Surgical History:  Procedure Laterality Date  . NO PAST SURGERIES     Family History: family history includes Dementia in her maternal grandmother; Heart attack in her paternal grandmother. Social History:  reports that she is a non-smoker but has been exposed to tobacco smoke. she has never used smokeless tobacco. She reports that she does not drink alcohol or use drugs.     Maternal Diabetes: No Genetic Screening: Normal Maternal Ultrasounds/Referrals: Normal Fetal Ultrasounds or other Referrals:  None Maternal Substance Abuse:  No Significant Maternal Medications:  None Significant Maternal Lab Results:  Lab values include: Group B Strep positive Other Comments:  None  Review of Systems  All other systems reviewed and are negative.  History Dilation: 1 Effacement (%): 70 Station: Ballotable Exam by:: Black & DeckerJenny Middleton RN Blood pressure 132/67, pulse 84, temperature 98.3 F (36.8 C), temperature source Oral, resp. rate 16, height 5' (1.524 m), weight 99.8 kg (220 lb), last menstrual period 06/29/2016. Exam Physical Exam  Constitutional: She is oriented to person, place, and time. She appears well-developed and well-nourished.  HENT:  Head: Atraumatic.  Eyes: EOM are normal.  Neck: Neck supple.  Cardiovascular: Normal rate and regular rhythm.  Respiratory: Breath sounds normal.  GI: Soft.  Musculoskeletal: Normal range of motion.  Neurological: She is alert and oriented to person, place, and time. She has normal reflexes.  Skin: Skin is warm and dry.  Psychiatric: She has a normal mood and affect.    Prenatal labs: ABO, Rh: B/Positive/-- (04/10 0000) Antibody: Negative  (04/10 0000) Rubella: Immune (04/10 0000) RPR: Nonreactive (04/10 0000)  HBsAg: Negative (04/10 0000)  HIV: Non-reactive (04/10 0000)  GBS: Positive (10/26 0000)   Assessment/Plan: Term gestation GBS cx (+) P) admit routine labs. IV PCN. Intracervical balloon placement( done), IV Pitocin. Analgesic prn   Claire Schmidt 08/25/2017, 9:31 AM

## 2017-08-25 NOTE — Progress Notes (Signed)
Opal Sidlesaleyah A Geisler is a 21 y.o. G1P0 at 5126w0d by LMP admitted for IOL at term  Subjective: Notes painful ctx. Trying natural labor. Has used Stadol  Objective: Pitocin 12 miu BP 128/78 (BP Location: Right Arm)   Pulse 90   Temp 97.9 F (36.6 C) (Oral)   Resp 16   Ht 5' (1.524 m)   Wt 99.8 kg (220 lb)   LMP 06/29/2016   BMI 42.97 kg/m  No intake/output data recorded. No intake/output data recorded.  FHT:  FHR: 130 bpm, variability: moderate,  accelerations:  Present,  decelerations:  Present early UC:   irregular, every 1-4 minutes( couplets/triplets) SVE:   6 cm dilated, 80% effaced, -3 station applied. midposition Tracing: cat1 AROM clear fluid. IUPC/ISE placed Labs: Lab Results  Component Value Date   WBC 12.2 (H) 08/25/2017   HGB 11.4 (L) 08/25/2017   HCT 35.1 (L) 08/25/2017   MCV 85.0 08/25/2017   PLT 333 08/25/2017    Assessment / Plan: Term gestation  GBS cx (+)  IV PCN Active protracted labor due to dysfunctional uterine pattern P) cont with pitocin. IV PCN . Exaggerated sims position  Anticipated MOD:  NSVD  Jaquisha Frech A Sheba Whaling 08/25/2017, 6:08 PM

## 2017-08-25 NOTE — Anesthesia Pain Management Evaluation Note (Signed)
  CRNA Pain Management Visit Note  Patient: Claire SidlesAaleyah A Fees, 21 y.o., female  "Hello [facility] am a [facility] of the anesthesia [facility]. We have an anesthesia team available at all times to provide care throughout the hospital, including epidural management and anesthesia for C-section. I don't know your plan for the delivery whether it a natural birth, water birth, IV sedation, nitrous supplementation, doula or epidural, but we want to meet your pain goals."   1.Was your pain managed to your expectations on prior hospitalizations?   No prior hospitalizations  2.What is your expectation for pain management during this hospitalization?     IV pain meds  3.How can we help you reach that goal? Support prn  Record the patient's initial score and the patient's pain goal.   Pain: 3  Pain Goal: 6 [facility] wants you to be able to say your pain was always managed very well.  [facility] Endoscopy CenterWRINKLE,Kimley Apsey 08/25/2017

## 2017-08-25 NOTE — Progress Notes (Signed)
S: screaming with ctx Exhausted  Pitocin 16 miu O;BP 126/63   Pulse (!) 103   Temp 97.7 F (36.5 C) (Oral)   Resp 18   Ht 5' (1.524 m)   Wt 99.8 kg (220 lb)   LMP 06/29/2016   BMI 42.97 kg/m  VE 6/80/-2 midposition Clear fluid Tracing baseline 120 (+) accel to 150  ctx q 2-3 mins  IMP: arrest of dilation GBS cx (+)  On IV PCN Term gestation P) recommend epidural to help relax pt and rest her as pitocin is continued. Pt and mother agree with plan

## 2017-08-26 ENCOUNTER — Encounter (HOSPITAL_COMMUNITY): Payer: Self-pay | Admitting: Obstetrics and Gynecology

## 2017-08-26 ENCOUNTER — Encounter (HOSPITAL_COMMUNITY)
Admission: RE | Disposition: A | Discharge status: Home / Self Care | Payer: Self-pay | Source: Ambulatory Visit | Attending: Obstetrics and Gynecology

## 2017-08-26 LAB — RPR: RPR: NONREACTIVE

## 2017-08-26 SURGERY — Surgical Case
Anesthesia: Epidural | Wound class: Clean Contaminated

## 2017-08-26 MED ORDER — OXYCODONE-ACETAMINOPHEN 5-325 MG PO TABS
1.0000 | ORAL_TABLET | ORAL | Status: DC | PRN
Start: 2017-08-26 — End: 2017-08-28
  Administered 2017-08-26 – 2017-08-27 (×4): 1 via ORAL
  Filled 2017-08-26 (×4): qty 1

## 2017-08-26 MED ORDER — LIDOCAINE-EPINEPHRINE (PF) 2 %-1:200000 IJ SOLN
INTRAMUSCULAR | Status: AC
  Filled 2017-08-26: qty 20

## 2017-08-26 MED ORDER — ZOLPIDEM TARTRATE 5 MG PO TABS: 5.0000 mg | ORAL_TABLET | Freq: Every evening | ORAL | Status: DC | PRN

## 2017-08-26 MED ORDER — SODIUM CHLORIDE 0.9 % IR SOLN
Status: DC | PRN
  Administered 2017-08-26: 1000 mL

## 2017-08-26 MED ORDER — METHYLERGONOVINE MALEATE 0.2 MG/ML IJ SOLN
INTRAMUSCULAR | Status: DC | PRN
  Administered 2017-08-26: 0.2 mg via INTRAMUSCULAR

## 2017-08-26 MED ORDER — HYDROMORPHONE HCL 1 MG/ML IJ SOLN
0.2500 mg | INTRAMUSCULAR | Status: DC | PRN
  Administered 2017-08-26 (×4): 0.5 mg via INTRAVENOUS

## 2017-08-26 MED ORDER — GLYCOPYRROLATE 0.2 MG/ML IJ SOLN
INTRAMUSCULAR | Status: AC
  Filled 2017-08-26: qty 1

## 2017-08-26 MED ORDER — LACTATED RINGERS IV SOLN
INTRAVENOUS | Status: DC | PRN
  Administered 2017-08-26: 09:00:00 via INTRAVENOUS

## 2017-08-26 MED ORDER — SIMETHICONE 80 MG PO CHEW
80.0000 mg | CHEWABLE_TABLET | ORAL | Status: DC
  Administered 2017-08-26 – 2017-08-28 (×2): 80 mg via ORAL
  Filled 2017-08-26 (×2): qty 1

## 2017-08-26 MED ORDER — MENTHOL 3 MG MT LOZG: 1.0000 | LOZENGE | OROMUCOSAL | Status: DC | PRN

## 2017-08-26 MED ORDER — CEFAZOLIN SODIUM-DEXTROSE 2-3 GM-%(50ML) IV SOLR
INTRAVENOUS | Status: DC | PRN
  Administered 2017-08-26: 2 g via INTRAVENOUS

## 2017-08-26 MED ORDER — EPHEDRINE SULFATE 50 MG/ML IJ SOLN
INTRAMUSCULAR | Status: DC | PRN
  Administered 2017-08-26: 10 mg via INTRAVENOUS

## 2017-08-26 MED ORDER — OXYCODONE-ACETAMINOPHEN 5-325 MG PO TABS: 2.0000 | ORAL_TABLET | ORAL | Status: DC | PRN

## 2017-08-26 MED ORDER — DIPHENHYDRAMINE HCL 25 MG PO CAPS: 25.0000 mg | ORAL_CAPSULE | Freq: Four times a day (QID) | ORAL | Status: DC | PRN

## 2017-08-26 MED ORDER — OXYTOCIN 10 UNIT/ML IJ SOLN
INTRAVENOUS | Status: DC | PRN
  Administered 2017-08-26: 40 [IU] via INTRAVENOUS

## 2017-08-26 MED ORDER — KETOROLAC TROMETHAMINE 30 MG/ML IJ SOLN
30.0000 mg | Freq: Once | INTRAMUSCULAR | Status: AC
  Administered 2017-08-26: 30 mg via INTRAVENOUS

## 2017-08-26 MED ORDER — EPHEDRINE 5 MG/ML INJ
INTRAVENOUS | Status: AC
  Filled 2017-08-26: qty 10

## 2017-08-26 MED ORDER — FENTANYL CITRATE (PF) 100 MCG/2ML IJ SOLN
INTRAMUSCULAR | Status: DC | PRN
  Administered 2017-08-26: 100 ug via INTRAVENOUS

## 2017-08-26 MED ORDER — DIBUCAINE 1 % RE OINT: 1.0000 "application " | TOPICAL_OINTMENT | RECTAL | Status: DC | PRN

## 2017-08-26 MED ORDER — MIDAZOLAM HCL 2 MG/2ML IJ SOLN
INTRAMUSCULAR | Status: DC | PRN
  Administered 2017-08-26: 2 mg via INTRAVENOUS

## 2017-08-26 MED ORDER — SUCCINYLCHOLINE CHLORIDE 200 MG/10ML IV SOSY
PREFILLED_SYRINGE | INTRAVENOUS | Status: AC
  Filled 2017-08-26: qty 10

## 2017-08-26 MED ORDER — WITCH HAZEL-GLYCERIN EX PADS: 1.0000 "application " | MEDICATED_PAD | CUTANEOUS | Status: DC | PRN

## 2017-08-26 MED ORDER — SIMETHICONE 80 MG PO CHEW
80.0000 mg | CHEWABLE_TABLET | Freq: Three times a day (TID) | ORAL | Status: DC
  Administered 2017-08-26 – 2017-08-28 (×4): 80 mg via ORAL
  Filled 2017-08-26 (×4): qty 1

## 2017-08-26 MED ORDER — KETOROLAC TROMETHAMINE 30 MG/ML IJ SOLN
INTRAMUSCULAR | Status: AC
  Filled 2017-08-26: qty 1

## 2017-08-26 MED ORDER — LIDOCAINE-EPINEPHRINE (PF) 2 %-1:200000 IJ SOLN
INTRAMUSCULAR | Status: DC | PRN
  Administered 2017-08-26 (×3): 5 mL via INTRADERMAL

## 2017-08-26 MED ORDER — HYDROMORPHONE HCL 1 MG/ML IJ SOLN
INTRAMUSCULAR | Status: AC
  Filled 2017-08-26: qty 1

## 2017-08-26 MED ORDER — GLYCOPYRROLATE 0.2 MG/ML IJ SOLN
INTRAMUSCULAR | Status: DC | PRN
  Administered 2017-08-26: 0.2 mg via INTRAVENOUS

## 2017-08-26 MED ORDER — PHENYLEPHRINE 40 MCG/ML (10ML) SYRINGE FOR IV PUSH (FOR BLOOD PRESSURE SUPPORT)
PREFILLED_SYRINGE | INTRAVENOUS | Status: AC
  Filled 2017-08-26: qty 10

## 2017-08-26 MED ORDER — CEFAZOLIN SODIUM-DEXTROSE 2-3 GM-%(50ML) IV SOLR
INTRAVENOUS | Status: AC
  Filled 2017-08-26: qty 50

## 2017-08-26 MED ORDER — SODIUM BICARBONATE 8.4 % IV SOLN
INTRAVENOUS | Status: AC
  Filled 2017-08-26: qty 50

## 2017-08-26 MED ORDER — COCONUT OIL OIL: 1.0000 "application " | TOPICAL_OIL | Status: DC | PRN

## 2017-08-26 MED ORDER — FENTANYL CITRATE (PF) 100 MCG/2ML IJ SOLN
INTRAMUSCULAR | Status: AC
  Filled 2017-08-26: qty 2

## 2017-08-26 MED ORDER — OXYTOCIN 40 UNITS IN LACTATED RINGERS INFUSION - SIMPLE MED: 2.5000 [IU]/h | INTRAVENOUS | Status: DC

## 2017-08-26 MED ORDER — SCOPOLAMINE 1 MG/3DAYS TD PT72
MEDICATED_PATCH | TRANSDERMAL | Status: AC
  Filled 2017-08-26: qty 1

## 2017-08-26 MED ORDER — OXYTOCIN 10 UNIT/ML IJ SOLN
INTRAMUSCULAR | Status: AC
  Filled 2017-08-26: qty 4

## 2017-08-26 MED ORDER — BUPIVACAINE HCL (PF) 0.25 % IJ SOLN
INTRAMUSCULAR | Status: DC | PRN
  Administered 2017-08-26: 30 mL

## 2017-08-26 MED ORDER — LACTATED RINGERS IV SOLN: INTRAVENOUS | Status: DC

## 2017-08-26 MED ORDER — PROMETHAZINE HCL 25 MG/ML IJ SOLN
6.2500 mg | INTRAMUSCULAR | Status: DC | PRN
Start: 2017-08-26 — End: 2017-08-26

## 2017-08-26 MED ORDER — PRENATAL MULTIVITAMIN CH
1.0000 | ORAL_TABLET | Freq: Every day | ORAL | Status: DC
  Administered 2017-08-27: 1 via ORAL
  Filled 2017-08-26: qty 1

## 2017-08-26 MED ORDER — ONDANSETRON HCL 4 MG/2ML IJ SOLN
INTRAMUSCULAR | Status: AC
  Filled 2017-08-26: qty 2

## 2017-08-26 MED ORDER — PHENYLEPHRINE HCL 10 MG/ML IJ SOLN
INTRAMUSCULAR | Status: DC | PRN
  Administered 2017-08-26 (×2): 80 ug via INTRAVENOUS

## 2017-08-26 MED ORDER — MORPHINE SULFATE (PF) 0.5 MG/ML IJ SOLN
INTRAMUSCULAR | Status: DC | PRN
  Administered 2017-08-26: 2 mg via EPIDURAL
  Administered 2017-08-26: 2 mg via INTRAVENOUS
  Administered 2017-08-26: 1 mg via EPIDURAL

## 2017-08-26 MED ORDER — MORPHINE SULFATE (PF) 0.5 MG/ML IJ SOLN
INTRAMUSCULAR | Status: AC
  Filled 2017-08-26: qty 10

## 2017-08-26 MED ORDER — IBUPROFEN 600 MG PO TABS
600.0000 mg | ORAL_TABLET | Freq: Four times a day (QID) | ORAL | Status: DC
  Administered 2017-08-26 – 2017-08-28 (×7): 600 mg via ORAL
  Filled 2017-08-26 (×7): qty 1

## 2017-08-26 MED ORDER — MIDAZOLAM HCL 2 MG/2ML IJ SOLN
INTRAMUSCULAR | Status: AC
  Filled 2017-08-26: qty 2

## 2017-08-26 MED ORDER — SENNOSIDES-DOCUSATE SODIUM 8.6-50 MG PO TABS
2.0000 | ORAL_TABLET | ORAL | Status: DC
  Administered 2017-08-26 – 2017-08-28 (×2): 2 via ORAL
  Filled 2017-08-26 (×2): qty 2

## 2017-08-26 MED ORDER — SIMETHICONE 80 MG PO CHEW: 80.0000 mg | CHEWABLE_TABLET | ORAL | Status: DC | PRN

## 2017-08-26 MED ORDER — PROPOFOL 10 MG/ML IV BOLUS
INTRAVENOUS | Status: AC
  Filled 2017-08-26: qty 20

## 2017-08-26 MED ORDER — BUPIVACAINE HCL (PF) 0.25 % IJ SOLN
INTRAMUSCULAR | Status: AC
  Filled 2017-08-26: qty 30

## 2017-08-26 SURGICAL SUPPLY — 47 items
APL SKNCLS STERI-STRIP NONHPOA (GAUZE/BANDAGES/DRESSINGS) ×1
BARRIER ADHS 3X4 INTERCEED (GAUZE/BANDAGES/DRESSINGS) ×3 IMPLANT
BENZOIN TINCTURE PRP APPL 2/3 (GAUZE/BANDAGES/DRESSINGS) ×2 IMPLANT
BRR ADH 4X3 ABS CNTRL BYND (GAUZE/BANDAGES/DRESSINGS) ×1
CHLORAPREP W/TINT 26ML (MISCELLANEOUS) ×3 IMPLANT
CLAMP CORD UMBIL (MISCELLANEOUS) IMPLANT
CLOSURE STERI-STRIP 1/4X4 (GAUZE/BANDAGES/DRESSINGS) ×2 IMPLANT
CLOSURE WOUND 1/2 X4 (GAUZE/BANDAGES/DRESSINGS)
CLOTH BEACON ORANGE TIMEOUT ST (SAFETY) ×3 IMPLANT
CONTAINER PREFILL 10% NBF 15ML (MISCELLANEOUS) IMPLANT
DRAPE C SECTION CLR SCREEN (DRAPES) ×3 IMPLANT
DRSG OPSITE POSTOP 4X10 (GAUZE/BANDAGES/DRESSINGS) ×3 IMPLANT
ELECT REM PT RETURN 9FT ADLT (ELECTROSURGICAL) ×3
ELECTRODE REM PT RTRN 9FT ADLT (ELECTROSURGICAL) ×1 IMPLANT
EXTRACTOR VACUUM M CUP 4 TUBE (SUCTIONS) IMPLANT
EXTRACTOR VACUUM M CUP 4' TUBE (SUCTIONS)
GLOVE BIOGEL PI IND STRL 7.0 (GLOVE) ×2 IMPLANT
GLOVE BIOGEL PI INDICATOR 7.0 (GLOVE) ×4
GLOVE ECLIPSE 6.5 STRL STRAW (GLOVE) ×3 IMPLANT
GOWN STRL REUS W/TWL LRG LVL3 (GOWN DISPOSABLE) ×6 IMPLANT
KIT ABG SYR 3ML LUER SLIP (SYRINGE) IMPLANT
NDL HYPO 25X5/8 SAFETYGLIDE (NEEDLE) IMPLANT
NEEDLE HYPO 22GX1.5 SAFETY (NEEDLE) ×3 IMPLANT
NEEDLE HYPO 25X5/8 SAFETYGLIDE (NEEDLE) IMPLANT
NS IRRIG 1000ML POUR BTL (IV SOLUTION) ×3 IMPLANT
PACK C SECTION WH (CUSTOM PROCEDURE TRAY) ×3 IMPLANT
PAD OB MATERNITY 4.3X12.25 (PERSONAL CARE ITEMS) ×3 IMPLANT
RTRCTR C-SECT PINK 25CM LRG (MISCELLANEOUS) ×2 IMPLANT
STRIP CLOSURE SKIN 1/2X4 (GAUZE/BANDAGES/DRESSINGS) IMPLANT
SUT CHROMIC GUT AB #0 18 (SUTURE) IMPLANT
SUT MNCRL 0 VIOLET CTX 36 (SUTURE) ×3 IMPLANT
SUT MON AB 2-0 SH 27 (SUTURE)
SUT MON AB 2-0 SH27 (SUTURE) IMPLANT
SUT MON AB 3-0 SH 27 (SUTURE)
SUT MON AB 3-0 SH27 (SUTURE) IMPLANT
SUT MON AB 4-0 PS1 27 (SUTURE) IMPLANT
SUT MONOCRYL 0 CTX 36 (SUTURE) ×6
SUT PLAIN 2 0 (SUTURE)
SUT PLAIN 2 0 XLH (SUTURE) IMPLANT
SUT PLAIN ABS 2-0 CT1 27XMFL (SUTURE) IMPLANT
SUT VIC AB 0 CT1 36 (SUTURE) ×6 IMPLANT
SUT VIC AB 2-0 CT1 27 (SUTURE) ×3
SUT VIC AB 2-0 CT1 TAPERPNT 27 (SUTURE) ×1 IMPLANT
SUT VIC AB 4-0 PS2 27 (SUTURE) IMPLANT
SYR CONTROL 10ML LL (SYRINGE) ×3 IMPLANT
TOWEL OR 17X24 6PK STRL BLUE (TOWEL DISPOSABLE) ×3 IMPLANT
TRAY FOLEY BAG SILVER LF 14FR (SET/KITS/TRAYS/PACK) IMPLANT

## 2017-08-26 NOTE — Progress Notes (Signed)
S; pt sleeping  VE; fully (+) 1 with large caput still asynclitic Tracing; cat 1  IMP: arrest of descent Term gestation P) primary C/S. Risk of surgery includes infection, bleeding, poss need for blood transfusion and its risk, internal scar tissue, injury to surrounding organ structures. ALL ? answered

## 2017-08-26 NOTE — Transfer of Care (Signed)
Immediate Anesthesia Transfer of Care Note  Patient: Claire Schmidt  Procedure(s) Performed: CESAREAN SECTION (N/A )  Patient Location: PACU  Anesthesia Type:General  Level of Consciousness: awake, alert  and oriented  Airway & Oxygen Therapy: Patient Spontanous Breathing and Patient connected to nasal cannula oxygen  Post-op Assessment: Report given to RN and Post -op Vital signs reviewed and stable  Post vital signs: Reviewed and stable  Last Vitals:  Vitals:   08/26/17 0701 08/26/17 0731  BP: 124/60 124/75  Pulse: 93 (!) 110  Resp:    Temp:    SpO2:      Last Pain:  Vitals:   08/26/17 0631  TempSrc: Oral  PainSc:          Complications: No apparent anesthesia complications

## 2017-08-26 NOTE — Anesthesia Postprocedure Evaluation (Signed)
Anesthesia Post Note  Patient: Delania A Harshberger  Procedure(s) Performed: CESAREAN SECTION (N/A )     Patient location during evaluation: Mother Baby Anesthesia Type: Epidural Level of consciousness: awake and alert and oriented Pain management: pain level controlled Vital Signs Assessment: post-procedure vital signs reviewed and stable Respiratory status: spontaneous breathing and nonlabored ventilation Cardiovascular status: stable Postop Assessment: no headache, no backache, epidural receding, patient able to bend at knees, no apparent nausea or vomiting and adequate PO intake Anesthetic complications: no    Last Vitals:  Vitals:   08/26/17 1241 08/26/17 1340  BP: 123/67 127/68  Pulse: (!) 109 (!) 102  Resp: 20 18  Temp: 37 C 36.8 C  SpO2: 95% 100%    Last Pain:  Vitals:   08/26/17 1340  TempSrc: Oral  PainSc:    Pain Goal: Patients Stated Pain Goal: 2 (08/26/17 1241)               Blythe Stanfordavid Adedayo Sally-Anne Wamble

## 2017-08-26 NOTE — Progress Notes (Signed)
S; notes vaginal pressure and painful ctx Pushing for two hours Pitocin off after decelerations noted when pt was placed on right side Fully dilated since 1:07 am Labored vtx down for 2 hrs  O; BP 114/76   Pulse 99   Temp 99.5 F (37.5 C) (Oral)   Resp 18   Ht 5' (1.524 m)   Wt 99.8 kg (220 lb)   LMP 06/29/2016   SpO2 99%   BMI 42.97 kg/m  VE fully /+1 station with caput asynclitic  Tracing: baseline 145 (+) accels (+) good variability Ctx q 2-5 mins  IMP: arrest of descent due to asynclitic presentation Term gestation  GBS cx (+) P) stop pushing. Dose epidural. Right exaggerated sims position. If fetus unable to tol right side Proceed with C/S. Otherwise restart pitocin and reassess in one hour. Plan explained to pt and family

## 2017-08-26 NOTE — Anesthesia Procedure Notes (Signed)
Procedure Name: Intubation Date/Time: 08/26/2017 9:47 AM Performed by: Heather RobertsSinger, James, MD Pre-anesthesia Checklist: Patient identified, Timeout performed, Emergency Drugs available, Suction available and Patient being monitored Patient Re-evaluated:Patient Re-evaluated prior to induction Oxygen Delivery Method: Circle system utilized Preoxygenation: Pre-oxygenation with 100% oxygen Induction Type: IV induction, Cricoid Pressure applied and Rapid sequence Laryngoscope Size: Miller and 2 Airway Equipment and Method: Stylet Placement Confirmation: ETT inserted through vocal cords under direct vision,  positive ETCO2 and breath sounds checked- equal and bilateral Secured at: 21 cm Tube secured with: Tape Dental Injury: Teeth and Oropharynx as per pre-operative assessment

## 2017-08-26 NOTE — Anesthesia Postprocedure Evaluation (Signed)
Anesthesia Post Note  Patient: Claire Schmidt  Procedure(s) Performed: CESAREAN SECTION (N/A )     Patient location during evaluation: PACU Anesthesia Type: General Level of consciousness: sedated Pain management: pain level controlled Vital Signs Assessment: post-procedure vital signs reviewed and stable Respiratory status: spontaneous breathing and respiratory function stable Cardiovascular status: stable Postop Assessment: no apparent nausea or vomiting Anesthetic complications: no    Last Vitals:  Vitals:   08/26/17 1015 08/26/17 1100  BP: 129/83   Pulse: (!) 104 98  Resp: 16 18  Temp:    SpO2: 100% 100%    Last Pain:  Vitals:   08/26/17 1100  TempSrc:   PainSc: 3    Pain Goal: Patients Stated Pain Goal: 2 (08/26/17 1100)               Derel Mcglasson DANIEL

## 2017-08-26 NOTE — Addendum Note (Signed)
Addendum  created 08/26/17 1410 by Lenox AhrAdeloye, Sumayya Muha A, CRNA   Sign clinical note

## 2017-08-26 NOTE — Brief Op Note (Signed)
08/25/2017 - 08/26/2017  9:30 AM  PATIENT:  Claire Schmidt  21 y.o. female  PRE-OPERATIVE DIAGNOSIS:  arrest of descent, term gestation  POST-OPERATIVE DIAGNOSIS:  arrest of descent, term gestation, LOP presentation  PROCEDURE:  Primary cesarean section, kerr hysterotomy  SURGEON:  Surgeon(s) and Role:    * Maxie Betterousins, Dashun Borre, MD - Primary  PHYSICIAN ASSISTANT:   ASSISTANTS: Arlan Organaniela Paul, CNM   ANESTHESIA:   general and failed epidural Findings: live female LOP presentation, nl tubes, polycystic ovaries bilaterally EBL:  500 mL   BLOOD ADMINISTERED:none  DRAINS: none   LOCAL MEDICATIONS USED:  MARCAINE     SPECIMEN:  No Specimen  DISPOSITION OF SPECIMEN:  N/A  COUNTS:  YES  TOURNIQUET:  * No tourniquets in log *  DICTATION: .Other Dictation: Dictation Number [id number]  PLAN OF CARE: Admit to inpatient   PATIENT DISPOSITION:  PACU - hemodynamically stable.   Delay start of Pharmacological VTE agent (>24hrs) due to surgical blood loss or risk of bleeding: no

## 2017-08-27 DIAGNOSIS — D62 Acute posthemorrhagic anemia: Secondary | ICD-10-CM

## 2017-08-27 DIAGNOSIS — Z98891 History of uterine scar from previous surgery: Secondary | ICD-10-CM

## 2017-08-27 LAB — CBC
HCT: 28.6 % — ABNORMAL LOW (ref 36.0–46.0)
Hemoglobin: 9.5 g/dL — ABNORMAL LOW (ref 12.0–15.0)
MCH: 28.4 pg (ref 26.0–34.0)
MCHC: 33.2 g/dL (ref 30.0–36.0)
MCV: 85.4 fL (ref 78.0–100.0)
Platelets: 268 10*3/uL (ref 150–400)
RBC: 3.35 MIL/uL — ABNORMAL LOW (ref 3.87–5.11)
RDW: 14.8 % (ref 11.5–15.5)
WBC: 15.2 10*3/uL — ABNORMAL HIGH (ref 4.0–10.5)

## 2017-08-27 LAB — BIRTH TISSUE RECOVERY COLLECTION (PLACENTA DONATION)

## 2017-08-27 MED ORDER — FERROUS SULFATE 325 (65 FE) MG PO TABS
325.0000 mg | ORAL_TABLET | Freq: Two times a day (BID) | ORAL | Status: DC
  Administered 2017-08-27 – 2017-08-28 (×3): 325 mg via ORAL
  Filled 2017-08-27 (×3): qty 1

## 2017-08-27 MED ORDER — MAGNESIUM OXIDE 400 (241.3 MG) MG PO TABS
400.0000 mg | ORAL_TABLET | Freq: Every day | ORAL | Status: DC
  Administered 2017-08-27 – 2017-08-28 (×2): 400 mg via ORAL
  Filled 2017-08-27 (×3): qty 1

## 2017-08-27 NOTE — Progress Notes (Addendum)
POSTOPERATIVE DAY # 1 S/P Primary LTCS for arrest of descent, baby girl, "Aalyah"   S:         Reports feeling "better than yesterday"             Tolerating po intake / no nausea / no vomiting / + flatus / no BM  Denies dizziness, SOB, or CP             Bleeding is moderate             Pain controlled with Motrin and Percocet             Up ad lib / ambulatory/ Foley catheter out this morning at 8am -  No void yet  Newborn breast feeding with formula supplementation     O:  VS: BP 116/69 (BP Location: Right Arm)   Pulse 86   Temp 98.3 F (36.8 C) (Oral)   Resp 18   Ht 5' (1.524 m)   Wt 99.8 kg (220 lb)   LMP 06/29/2016   SpO2 96%   BMI 42.97 kg/m    LABS:               Recent Labs    08/25/17 0830 08/27/17 0542  WBC 12.2* 15.2*  HGB 11.4* 9.5*  PLT 333 268               Bloodtype: --/--/B POS (11/18 0825)  Rubella: Immune (04/10 0000)                                             I&O: Intake/Output      11/19 0701 - 11/20 0700 11/20 0701 - 11/21 0700   P.O. 360    I.V. (mL/kg) 2800 (28.1)    Total Intake(mL/kg) 3160 (31.7)    Urine (mL/kg/hr) 3575 (1.5) 600 (3.2)   Blood 1000    Total Output 4575 600   Net -1415 -600                     Physical Exam:             Alert and Oriented X3  Lungs: Clear and unlabored  Heart: regular rate and rhythm / no murmurs  Abdomen: soft, non-tender, non-distended, active bowel sounds              Fundus: firm, non-tender, U-1             Dressing: honeycomb dressing with steri-strips c/d/i             Incision:  approximated with sutures / no erythema / no ecchymosis / no drainage  Perineum: intact  Lochia: appropriate, no clots  Extremities: no edema, no calf pain or tenderness  A:        POD # 1 S/P Primary LTCS for arrest of descent            ABL Anemia - stable asymptomatic   P:        Routine postoperative care              Ferrous sulfate 325mg  BID WC  Magnesium oxide 400mg  daily  D/C IV after pt. Voids x  2  Encouraged to rest when baby rests  May shower later this afternoon  See lactation today   Continue current care   Carlean JewsMeredith Qunicy Higinbotham, MSN, CNM  [facility]

## 2017-08-27 NOTE — Lactation Note (Signed)
This note was copied from a baby's chart. Lactation Consultation Note Baby 16 hrs old, cueing to BF. Mom called for latch assistance. Mom has pendulous breast w/flat nipple at the bottom of breast. Rt. Nipple large w/edema, tissue thick, not compressible. Baby unable to get nipple into mouth. Applied #24 NS. Breast tissue not compatible for a NS at this time d/t nipple at the bottom of breast, NS will not stay on d/t shape of breast.  Baby tongue thrust. Sucks on top lip at times when comes off trying to latch. W/LC t-cupping nipple in mouth, baby licks to top of nipple. Unable to obtain a deep latch or get the Rt. Nipple in baby's mouth. Hand pump given to evert nipple, reverse pressure attempted. Shells given to mom, strongly encouraged mom to wear bra and shells. Lt. Nipple just slightly smaller, but compressible. Nipple to large to get into baby's mouth. LC feels that since nipple is compressible if baby wasn't tongue thrusting and would suck the nipple in the mouth, baby may could feed on the Lt. Nipple. Unable to tell at this time.  Breast massage and hand expressed 4 ml colostrum. Spoon fed baby. Baby will not suck on LC gloved finger. Bites hard. Has very high square shaped palate, w/gums thick in width. Asked mom if baby was biting her, mom stated "no". Newborn behavior discussed as well as, STS, I&O, cluster feeding, supply and demand. Mom encouraged to feed baby 8-12 times/24 hours and with feeding cues.  Mom plans to BF for 1 week to establish milk supply then pump and bottle feed. Discussed pumping and bottle feeding schedule and establishing milk supply.  Mom shown how to use DEBP & how to disassemble, clean, & reassemble parts. Mom knows to pump q3h for 15-20 min. Mom pumped w/no colostrum collected. Encouraged to hand express colostrum after pumping for supplement. Mom has a DEBP at home. Mom thinks it may start w/"A".  Baby has elevated TCB, lab into draw bili serum. Discussed importance of  feeding colostrum extra after BF.  Mom will need assistance latching d/t tongue thrusting, and edema w/large nipples.  WH/LC brochure given w/resources, support groups and LC services.  Patient Name: Claire Schmidt ZOXWR'UToday's Date: 08/27/2017 Reason for consult: Initial assessment;Difficult latch;Mother's request   Maternal Data Has patient been taught Hand Expression?: Yes Does the patient have breastfeeding experience prior to this delivery?: No  Feeding Feeding Type: Breast Milk Length of feed: 0 min  LATCH Score Latch: Too sleepy or reluctant, no latch achieved, no sucking elicited.  Audible Swallowing: None  Type of Nipple: Flat  Comfort (Breast/Nipple): Soft / non-tender  Hold (Positioning): Full assist, staff holds infant at breast  LATCH Score: 3  Interventions Interventions: Breast feeding basics reviewed;Reverse pressure;Assisted with latch;Breast compression;Shells;Skin to skin;Adjust position;Breast massage;Support pillows;Hand pump;Hand express;Position options;DEBP;Pre-pump if needed;Expressed milk  Lactation Tools Discussed/Used Tools: Shells;Pump;Nipple Shields Nipple shield size: 24 Shell Type: Inverted Breast pump type: Double-Electric Breast Pump;Manual WIC Program: Yes Pump Review: Setup, frequency, and cleaning;Milk Storage Initiated by:: Peri JeffersonL. Quasim Doyon RN IBCLC Date initiated:: 08/27/17   Consult Status Consult Status: Follow-up Date: 08/27/17 Follow-up type: In-patient    Ricci Paff, Diamond NickelLAURA G 08/27/2017, 1:22 AM

## 2017-08-27 NOTE — Plan of Care (Signed)
Progressing. Stood at side of bed.

## 2017-08-27 NOTE — Op Note (Signed)
NAME:  Claire Schmidt, Claire Schmidt                  ACCOUNT NOTimmie Foerster.:  MEDICAL RECORD NO.:  [id number]  LOCATION:                                 FACILITY:  PHYSICIAN:  Maxie BetterSheronette Shinita Mac, M.D.DATE OF BIRTH:  1995-10-23  DATE OF PROCEDURE:  08/26/2017 DATE OF DISCHARGE:                              OPERATIVE REPORT   PREOPERATIVE DIAGNOSES: 1. Arrest of descent. 2. Term gestation.  PROCEDURES: 1. Primary cesarean section, Kerr hysterotomy.  POSTOPERATIVE DIAGNOSES: 1. Arrest of descent. 2. Term gestation. 3. Left occiput posterior presentation.  ANESTHESIA:  Failed epidural, general anesthesia.  SURGEON:  Maxie BetterSheronette Ryan Ogborn, M.D.  ASSISTANT:  Arlan Organaniela Paul, CNM.  DESCRIPTION OF PROCEDURE:  The patient was transferred to the operating room, whereupon her epidural had been dosed.  The patient was, at that point, sterilely prepped and draped.  Indwelling Foley catheter was already in place.  On testing the patient, it was noted that she did not have an adequate level on the left side to sustain this surgical procedure.  Decision was then made by the anesthesiologist to convert to general anesthesia; at which time, that was imparted to the patient.  In preparation, the patient underwent an uncomplicated general anesthesia. A Pfannenstiel skin incision was then quickly made, carried down to the rectus fascia.  Rectus fascia was opened transversely.  The rectus fascia was then bluntly and sharply dissected off the rectus muscle in superior and inferior fashion.  The rectus muscles split in the midline. The parietal peritoneum was entered sharply and extended.  Self- retaining Alexis retractor was then placed.  A curvilinear lower uterine segment incision was then made and extended with bandage scissors. Clear amniotic fluid was noted.  Subsequent delivery of a live female from the left occiput posterior presentation with nuchal cord x1 was accomplished.  The baby was bulb suctioned in the  abdomen.  The cord was clamped, cut.  The baby was transferred to the awaiting pediatricians, who subsequently assigned Apgars of 8 and 9 at one and five minutes respectively.  The placenta was spontaneous intact, not sent.  Uterine cavity was cleaned of debris.  Uterine incision had a small extension on the right inferior aspect of the incision, which was separately closed with 0 Monocryl running lock stitch.  The remaining incision was closed in the usual fashion using 0 Monocryl running lock stitch.  The second layer was imbricated using 0 Monocryl suture.  Overlapping layer was also placed on the extension inferiorly.  The extension was no more than an inch long.  Normal tubes were noted bilaterally.  Large polycystic ovaries were noted bilaterally.  The abdomen was irrigated and suctioned of debris.  Small bleeder on the left angle resulted in another figure- of-eight suture being placed.  With good hemostasis noted, Interceed was placed in inverted T-fashion overlying the incision.  The Alexis retractor was removed.  The parietal peritoneum was then closed with 2-0 Vicryl.  The rectus fascia was closed with 0 Vicryl x2.  The subcutaneous area was irrigated, small bleeders cauterized.  Interrupted 2-0 plain sutures placed and the skin approximated using 4-0 Vicryl subcuticular closure.  Steri-Strips and benzoin were placed.  SPECIMEN:  None.  ESTIMATED BLOOD LOSS:  500 mL.  INTRAOPERATIVE FLUID:  1600 mL.  URINE OUTPUT:  100 mL.  Sponge and instrument counts x2 were correct.  COMPLICATIONS:  None.  The patient tolerated the procedure well, was transferred to recovery in stable condition.     [name], M.D.     [place]/MEDQ  D:  08/26/2017  T:  08/27/2017  Job:  [id number]

## 2017-08-27 NOTE — Lactation Note (Signed)
This note was copied from a baby's chart. Lactation Consultation Note  Patient Name: Claire Schmidt ZOXWR'UToday's Date: 08/27/2017 Reason for consult: Follow-up assessment;Difficult latch   Follow up with mom of 27 hour old infant. RN is unable to get infant to latch to the breast. Infant with 1 BF for 25 minutes, 6 BF attempts, formula x 2 24-28 cc, EBM a 2 of 1-4 cc.   Mom with soft compressible breasts with semi flat nipples that evert with stimulation. No colostrum expressible at this time.   Attempted to latch infant to the left breast in the football hold. Infant would not open mouth and was noted to be tongue thrusting. Infant did open narrowly a few times but would not take entire nipple in the mouth. Applied # 24 NS and relatched infant. Infant did latch after much work. She held nipple/nipple shield in her mouth and would not suckle. [facility] still [facility] the [facility] although not [facility].   Discussed suck training before feeding and jaw massage to try and loosen jaw muscles. Enc mom to encourage infant to open mouth before bottle feeding to get infant used to opening mouth.   Reviewed supplementation amounts with mom and enc mom to pump after BF then hand expression if infant not latching.   Report to Dolly RiasKim Isley, RN.        Maternal Data Formula Feeding for Exclusion: No Has patient been taught Hand Expression?: Yes Does the patient have breastfeeding experience prior to this delivery?: No  Feeding Feeding Type: Breast Fed  LATCH Score Latch: Too sleepy or reluctant, no latch achieved, no sucking elicited.  Audible Swallowing: None  Type of Nipple: Everted at rest and after stimulation  Comfort (Breast/Nipple): Soft / non-tender  Hold (Positioning): Assistance needed to correctly position infant at breast and maintain latch.  LATCH Score: 5  Interventions Interventions: Breast feeding basics reviewed;Support pillows;Assisted with latch;Position  options;Skin to skin;Expressed milk;Hand express;DEBP;Breast compression  Lactation Tools Discussed/Used Tools: Nipple Shields Nipple shield size: 24 Breast pump type: Double-Electric Breast Pump WIC Program: Yes Pump Review: Setup, frequency, and cleaning;Milk Storage Initiated by:: Reviewed and encouraged if infant not willing to latch and feed   Consult Status Consult Status: Follow-up Date: 08/28/17 Follow-up type: In-patient    [name] 08/27/2017, 12:16 PM

## 2017-08-28 ENCOUNTER — Encounter (HOSPITAL_COMMUNITY): Payer: Self-pay | Admitting: *Deleted

## 2017-08-28 MED ORDER — OXYCODONE-ACETAMINOPHEN 5-325 MG PO TABS: 1.0000 | ORAL_TABLET | ORAL | 0 refills | Status: DC | PRN

## 2017-08-28 MED ORDER — FERROUS SULFATE 325 (65 FE) MG PO TABS: 325.0000 mg | ORAL_TABLET | Freq: Two times a day (BID) | ORAL | 3 refills | Status: DC

## 2017-08-28 MED ORDER — IBUPROFEN 600 MG PO TABS: 600.0000 mg | ORAL_TABLET | Freq: Four times a day (QID) | ORAL | 0 refills | Status: DC

## 2017-08-28 NOTE — Lactation Note (Signed)
This note was copied from a baby's chart. Lactation Consultation Note  Patient Name: Claire Schmidt WUJWJ'XToday's Date: 08/28/2017 Reason for consult: Follow-up assessment  Follow up visit at 49 hours of age.  Mom reports baby is not doing well with breastfeedings.  She reports trying with NS and still baby is not sucking well at breast.  Baby is bottle fed with formula.  Mom is awaiting discharge home.   Lc encouraged mom to keep trying to latch baby and supplement with formula until she is getting breastmilk with pumping.  Mom has home pump.   Discussed milk transitioning to larger volume, engorgement care discussed.  Encouraged frequent feedings. Mom to soften breast as needed prior to latch.   Mom aware of o/p services as needed and understands need for Lc follow up if using NS after discharge.     Maternal Data    Feeding Feeding Type: Bottle Fed - Formula Nipple Type: Slow - flow  LATCH Score                   Interventions    Lactation Tools Discussed/Used     Consult Status Consult Status: Complete    Kinnick Maus 08/28/2017, 10:14 AM

## 2017-08-28 NOTE — Discharge Summary (Signed)
Obstetric Discharge Summary   Patient Name: Claire Schmidt DOB: 03/07/1996 MRN: [id number]  Date of Admission: 08/25/2017 Date of Discharge: 08/28/2017 Date of Delivery: 08/26/17 Gestational Age at Delivery: 8152w3d  [facility]. Cherly [facility]  Antepartum complications:  - GBS Positive  Prenatal Labs:  ABO, Rh: B/Positive/-- (04/10 0000) Antibody: Negative (04/10 0000) Rubella: Immune (04/10 0000) RPR: Nonreactive (04/10 0000)  HBsAg: Negative (04/10 0000)  HIV: Non-reactive (04/10 0000)  GBS: Positive (10/26 0000)   Admitting Diagnosis: IOL at term   Secondary Diagnoses: Patient Active Problem List   Diagnosis Date Noted  . Acute blood loss anemia 08/27/2017  . S/P cesarean section: indication: arrest of descent  08/27/2017  . Postpartum care following cesarean delivery (11/19) 08/26/2017  . Encounter for elective induction of labor 08/25/2017  . Hyperthyroidism 12/31/2014  . H/O allergic urticaria 12/02/2014  . Atopic dermatitis 05/18/2014  . Allergic rhinitis 03/22/2014  . Low back pain 11/28/2012    Date of Delivery: 08/26/17 Delivered By: Dr. Cherly Hensenousins/ D. Renae FicklePaul CNM assist Delivery Type: primary cesarean section, low transverse incision Anesthesia:  Failed epidural, general   Newborn Data: Live born female  Birth Weight: 7 lb 3.7 oz (3280 g) APGAR: 8, 9  Newborn Delivery   Birth date/time:  08/26/2017 08:50:00 Delivery type:  C-Section, Low Transverse C-section categorization:  Primary     Postpartum Course  (Cesarean Section):  Patient had an uncomplicated postpartum course.  By time of discharge on POD#2, her pain was controlled on oral pain medications; she had appropriate lochia and was ambulating, voiding without difficulty, tolerating regular diet and passing flatus.   She was deemed stable for discharge to home.     Labs: CBC Latest Ref Rng & Units 08/27/2017 08/25/2017 08/15/2017  WBC 4.0 - 10.5 K/uL 15.2(H) 12.2(H) 10.8(H)   Hemoglobin 12.0 - 15.0 g/dL 2.1(H9.5(L) 11.4(L) 11.6(L)  Hematocrit 36.0 - 46.0 % 28.6(L) 35.1(L) 34.3(L)  Platelets 150 - 400 K/uL 268 333 319   B POS  Physical exam:  BP 122/70 (BP Location: Right Arm)   Pulse 83   Temp 98.5 F (36.9 C) (Oral)   Resp 18   Ht 5' (1.524 m)   Wt 99.8 kg (220 lb)   LMP 06/29/2016   SpO2 95%   BMI 42.97 kg/m  General: alert and no distress Pulm: normal respiratory effort Lochia: appropriate Abdomen: soft, NT Uterine Fundus: firm, below umbilicus Perineum: healing well, no significant erythema, no significant edema Incision: c/d/i, healing well, no significant drainage, no dehiscence, no significant erythema Extremities: No evidence of DVT seen on physical exam. No lower extremity edema.   Disposition: stable, discharge to home Baby Feeding: breast milk and formula Baby Disposition: home with mom  Contraception: Nexplanon   Rh Immune globulin given: N/A Rubella vaccine given: N/A Tdap vaccine given in AP or PP setting: UTD Flu vaccine given in AP or PP setting: UTD   Plan:  Latrisa A Ferrone was discharged to home in good condition. [facility]  Discharge Instructions: Per After Visit Summary. Activity: Advance as tolerated. Pelvic rest for 6 weeks.  Refer to After Visit Summary Diet: Regular, Heart Healthy Discharge Medications: Allergies as of 08/28/2017      Reactions   Bee Venom Anaphylaxis      Medication List    STOP taking these medications   acetaminophen 500 MG tablet Commonly known as:  TYLENOL     TAKE these medications   ferrous  sulfate 325 (65 FE) MG tablet Take 1 tablet (325 mg total) by mouth 2 (two) times daily with a meal.   ibuprofen 600 MG tablet Commonly known as:  ADVIL,MOTRIN Take 1 tablet (600 mg total) by mouth every 6 (six) hours.   oxyCODONE-acetaminophen 5-325 MG tablet Commonly known as:  PERCOCET/ROXICET Take 1 tablet by mouth every 4 (four) hours as  needed (pain scale 4-7).   prenatal multivitamin Tabs tablet Take 1 tablet by mouth daily at 12 noon.      Outpatient follow up:  Follow-up Information    Maxie Betterousins, Ken Bonn, MD. Schedule an appointment as soon as possible for a visit in 6 week(s).   Specialty:  Obstetrics and Gynecology Why:  Postpartum visit  Contact information: [address] [place] [id number] [phone]           Signed:  Carlean JewsMeredith Sigmon, MSN, CNM [facility]

## 2017-08-28 NOTE — Progress Notes (Signed)
POSTOPERATIVE DAY # 2 S/P Primary LTCS for arrest of descent, baby girl, "Aalyah"  S:         Reports feeling much better today, states she is ready to be discharged home today              Tolerating po intake / no nausea / no vomiting / + flatus / no BM  Denies dizziness, SOB, or CP             Bleeding is moderate             Pain controlled with Motrin and Percocet             Up ad lib / ambulatory/ voiding QS  Newborn breast feeding with formula supplementation - reports she is pumping colostrum     O:  VS: BP 122/70 (BP Location: Right Arm)   Pulse 83   Temp 98.5 F (36.9 C) (Oral)   Resp 18   Ht 5' (1.524 m)   Wt 99.8 kg (220 lb)   LMP 06/29/2016   SpO2 95%   BMI 42.97 kg/m    LABS:               Recent Labs    08/27/17 0542  WBC 15.2*  HGB 9.5*  PLT 268               Bloodtype: --/--/B POS (11/18 0825)  Rubella: Immune (04/10 0000)                                             I&O: Intake/Output      11/20 0701 - 11/21 0700 11/21 0701 - 11/22 0700   P.O.     I.V. (mL/kg)     Total Intake(mL/kg)     Urine (mL/kg/hr) 1450 (0.6)    Blood     Total Output 1450    Net -1450                      Physical Exam:             Alert and Oriented X3  Lungs: Clear and unlabored  Heart: regular rate and rhythm / no murmurs  Abdomen: soft, non-tender, non-distended, active bowel sounds in all quadrants             Fundus: firm, non-tender, U-1             Dressing: honeycomb dsg with steri-strips c/d/i              Incision:  approximated with sutures / no erythema / no ecchymosis / no drainage  Perineum: intact  Lochia: appropriate, no clots   Extremities: no edema, no calf pain or tenderness  A:        POD # 2 S/P Primary LTCS for arrest of descent            ABL Anemia - stable asymptomatic   P:        Routine postoperative care              Discharge home today  WOB discharge book and instructions given   Recommend to continue prenatal vitamin and iron  supplements  F/u in 6 weeks with Dr. Nelle Donousins   Meredith Sigmon, MSN, CNM [facility]

## 2017-08-28 NOTE — Plan of Care (Signed)
Progressing appropriately, expresses improved comfort with increased activity level.

## 2017-10-03 DIAGNOSIS — Z130 Encounter for screening for diseases of the blood and blood-forming organs and certain disorders involving the immune mechanism: Secondary | ICD-10-CM | POA: Diagnosis not present

## 2017-10-03 DIAGNOSIS — Z3046 Encounter for surveillance of implantable subdermal contraceptive: Secondary | ICD-10-CM | POA: Diagnosis not present

## 2018-06-08 ENCOUNTER — Ambulatory Visit (HOSPITAL_COMMUNITY)
Admission: EM | Admit: 2018-06-08 | Discharge: 2018-06-08 | Disposition: A | Discharge status: Home / Self Care | Payer: BLUE CROSS/BLUE SHIELD | Attending: Family Medicine | Admitting: Family Medicine

## 2018-06-08 ENCOUNTER — Other Ambulatory Visit: Payer: Self-pay

## 2018-06-08 ENCOUNTER — Encounter (HOSPITAL_COMMUNITY): Payer: Self-pay

## 2018-06-08 DIAGNOSIS — J069 Acute upper respiratory infection, unspecified: Secondary | ICD-10-CM

## 2018-06-08 DIAGNOSIS — B9789 Other viral agents as the cause of diseases classified elsewhere: Secondary | ICD-10-CM

## 2018-06-08 DIAGNOSIS — R05 Cough: Secondary | ICD-10-CM

## 2018-06-08 MED ORDER — ALBUTEROL SULFATE HFA 108 (90 BASE) MCG/ACT IN AERS: 1.0000 | INHALATION_SPRAY | Freq: Once | RESPIRATORY_TRACT | Status: DC

## 2018-06-08 MED ORDER — FLUTICASONE PROPIONATE 50 MCG/ACT NA SUSP: 2.0000 | Freq: Every day | NASAL | 0 refills | Status: DC

## 2018-06-08 MED ORDER — ALBUTEROL SULFATE HFA 108 (90 BASE) MCG/ACT IN AERS
INHALATION_SPRAY | RESPIRATORY_TRACT | Status: AC
  Filled 2018-06-08: qty 6.7

## 2018-06-08 MED ORDER — CETIRIZINE-PSEUDOEPHEDRINE ER 5-120 MG PO TB12: 1.0000 | ORAL_TABLET | Freq: Every day | ORAL | 0 refills | Status: DC

## 2018-06-08 NOTE — ED Provider Notes (Signed)
[facility]   [id number] 06/08/18 Arrival Time: 1336  [id number] throat and nasal congestion  SUBJECTIVE: History from: patient.  Claire Schmidt is a 22 y.o. female who presents with abrupt onset of sore throat and congestion x 3 days.  Denies positive sick exposure or precipitating event.  Has tried benadryl without relief.  Symptoms are made worse with swallowing, but tolerating liquids and own secretions without difficulty.  Denies previous symptoms in the past.   Complains of associated chills, sinue pressure, dry cough, rhinorrhea, and difficulty breathing through nose.  Denies fever, fatigue, chest pain, nausea, rash, changes in bowel or bladder habits.    ROS: As per HPI.  Past Medical History:  Diagnosis Date  . Headache   . Medical history non-contributory    Past Surgical History:  Procedure Laterality Date  . CESAREAN SECTION N/A 08/26/2017   Procedure: CESAREAN SECTION;  Surgeon: Maxie Better, MD;  Location: [facility] BIRTHING SUITES;  Service: Obstetrics;  Laterality: N/A;  . NO PAST SURGERIES     Allergies  Allergen Reactions  . Bee Venom Anaphylaxis   No current facility-administered medications on file prior to encounter.    Current Outpatient Medications on File Prior to Encounter  Medication Sig Dispense Refill  . ferrous sulfate 325 (65 FE) MG tablet Take 1 tablet (325 mg total) by mouth 2 (two) times daily with a meal. (Patient not taking: Reported on 06/08/2018) 60 tablet 3  . ibuprofen (ADVIL,MOTRIN) 600 MG tablet Take 1 tablet (600 mg total) by mouth every 6 (six) hours. 30 tablet 0  . oxyCODONE-acetaminophen (PERCOCET/ROXICET) 5-325 MG tablet Take 1 tablet by mouth every 4 (four) hours as needed (pain scale 4-7). (Patient not taking: Reported on 06/08/2018) 30 tablet 0  . Prenatal Vit-Fe Fumarate-FA (PRENATAL MULTIVITAMIN) TABS tablet Take 1 tablet by mouth daily at 12 noon.     Social History   Socioeconomic History  . Marital status: Single   Spouse name: Not on file  . Number of children: Not on file  . Years of education: Not on file  . Highest education level: Not on file  Occupational History  . Not on file  Social Needs  . Financial resource strain: Not on file  . Food insecurity:    Worry: Not on file    Inability: Not on file  . Transportation needs:    Medical: Not on file    Non-medical: Not on file  Tobacco Use  . Smoking status: Passive Smoke Exposure - Never Smoker  . Smokeless tobacco: Never Used  . Tobacco comment: Pt states no current smokers in household  Substance and Sexual Activity  . Alcohol use: No    Alcohol/week: 0.0 standard drinks  . Drug use: No  . Sexual activity: Yes  Lifestyle  . Physical activity:    Days per week: Not on file    Minutes per session: Not on file  . Stress: Not on file  Relationships  . Social connections:    Talks on phone: Not on file    Gets together: Not on file    Attends religious service: Not on file    Active member of club or organization: Not on file    Attends meetings of clubs or organizations: Not on file    Relationship status: Not on file  . Intimate partner violence:    Fear of current or ex partner: Not on file    Emotionally abused: Not on file    Physically abused: Not  on file    Forced sexual activity: Not on file  Other Topics Concern  . Not on file  Social History Narrative   Lives with mom and dad.  Middle of 3 girls. 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 Dementia Maternal Grandmother   . Heart attack Paternal Grandmother   . Asthma Neg Hx   . Diabetes Neg Hx   . Heart disease Neg Hx   . Hyperlipidemia Neg Hx   . Hypertension Neg Hx     OBJECTIVE:  Vitals:   06/08/18 1356 06/08/18 1359  BP:  123/72  Pulse:  100  Resp:  18  Temp:  98.2 F (36.8 C)  SpO2:  100%  Weight: 214 lb  3.2 oz (97.2 kg)      General appearance: alert; appears fatigued; nontoxic HEENT: Ears: EACs clear, TMs pearly gray with visible cone of light, without erythema; Eyes: PERRL, EOMI grossly;  Nose: clear rhinorrhea; Throat: oropharynx mildly erythematous, tonsils 1+ without white tonsillar exudates, uvula midline Neck: supple without LAD Lungs: CTA bilaterally without adventitious breath sounds Heart: regular rate and rhythm.  Radial pulses 2+ symmetrical bilaterally Skin: warm and dry Psychological: alert and cooperative; normal mood and affect  ASSESSMENT & PLAN:  1. Viral URI with cough     Meds ordered this encounter  Medications  . albuterol (PROVENTIL HFA;VENTOLIN HFA) 108 (90 Base) MCG/ACT inhaler 1 puff  . cetirizine-pseudoephedrine (ZYRTEC-D) 5-120 MG tablet    Sig: Take 1 tablet by mouth daily.    Dispense:  30 tablet    Refill:  0    Order Specific Question:   Supervising Provider    Answer:   Isa Rankin [phone]  . fluticasone (FLONASE) 50 MCG/ACT nasal spray    Sig: Place 2 sprays into both nostrils daily.    Dispense:  16 g    Refill:  0    Order Specific Question:   Supervising Provider    Answer:   Isa Rankin [[id number]]   Albuterol inhaler given in office.  Use as needed for shortness of breath or wheezing Get plenty of rest and push fluids Zyrtec D prescribed Flonase prescribed Use medications as prescribed for symptomatic relief Continue OTC medications as needed  Follow up with PCP if symptoms persists Return or go to ER if patient has any new or worsening symptoms   Reviewed expectations re: course of current medical issues. Questions answered. Outlined signs and symptoms indicating need for more acute intervention. Patient verbalized understanding. After Visit Summary given.        Rennis Harding, PA-C 06/08/18 1529

## 2018-06-08 NOTE — Discharge Instructions (Addendum)
Albuterol inhaler given in office.  Use as needed for shortness of breath or wheezing Get plenty of rest and push fluids Zyrtec D prescribed.  Do not take if breast feeding Flonase prescribed Use medications as prescribed for symptomatic relief Continue OTC medications as needed  Follow up with PCP if symptoms persists Return or go to ER if patient has any new or worsening symptoms

## 2018-06-08 NOTE — ED Triage Notes (Signed)
Pt states she has sore throat and congestion x  3 days

## 2018-08-15 ENCOUNTER — Ambulatory Visit (HOSPITAL_COMMUNITY)
Admission: EM | Admit: 2018-08-15 | Discharge: 2018-08-15 | Disposition: A | Discharge status: Home / Self Care | Payer: BLUE CROSS/BLUE SHIELD | Attending: Family Medicine | Admitting: Family Medicine

## 2018-08-15 ENCOUNTER — Encounter (HOSPITAL_COMMUNITY): Payer: Self-pay | Admitting: Emergency Medicine

## 2018-08-15 DIAGNOSIS — J029 Acute pharyngitis, unspecified: Secondary | ICD-10-CM

## 2018-08-15 DIAGNOSIS — Z7952 Long term (current) use of systemic steroids: Secondary | ICD-10-CM | POA: Diagnosis not present

## 2018-08-15 DIAGNOSIS — Z91030 Bee allergy status: Secondary | ICD-10-CM | POA: Diagnosis not present

## 2018-08-15 DIAGNOSIS — Z7722 Contact with and (suspected) exposure to environmental tobacco smoke (acute) (chronic): Secondary | ICD-10-CM | POA: Diagnosis not present

## 2018-08-15 DIAGNOSIS — J040 Acute laryngitis: Secondary | ICD-10-CM | POA: Insufficient documentation

## 2018-08-15 DIAGNOSIS — Z79899 Other long term (current) drug therapy: Secondary | ICD-10-CM | POA: Diagnosis not present

## 2018-08-15 DIAGNOSIS — M791 Myalgia, unspecified site: Secondary | ICD-10-CM | POA: Diagnosis not present

## 2018-08-15 DIAGNOSIS — R05 Cough: Secondary | ICD-10-CM

## 2018-08-15 LAB — POCT RAPID STREP A: STREPTOCOCCUS, GROUP A SCREEN (DIRECT): NEGATIVE

## 2018-08-15 MED ORDER — IPRATROPIUM BROMIDE 0.06 % NA SOLN: 2.0000 | Freq: Four times a day (QID) | NASAL | 0 refills | Status: DC

## 2018-08-15 MED ORDER — BENZONATATE 100 MG PO CAPS: 100.0000 mg | ORAL_CAPSULE | Freq: Three times a day (TID) | ORAL | 0 refills | Status: DC

## 2018-08-15 MED ORDER — PREDNISONE 50 MG PO TABS: 50.0000 mg | ORAL_TABLET | Freq: Every day | ORAL | 0 refills | Status: DC

## 2018-08-15 NOTE — ED Provider Notes (Signed)
[facility]    CSN: [id number] Arrival date & time: 08/15/18  1933     History   Chief Complaint Chief Complaint  Patient presents with  . Sore Throat    HPI Claire Schmidt is a 22 y.o. female.   22 year old female comes in for 6-day history of URI symptoms she had has a cough, sore throat, hoarseness, body aches.  No obvious rhinorrhea, nasal congestion.  Denies fever, chills, night sweats.  Has still been eating and drinking without problems.  OTC cold medication without relief.  Never smoker.      Past Medical History:  Diagnosis Date  . Headache   . Medical history non-contributory     Patient Active Problem List   Diagnosis Date Noted  . Acute blood loss anemia 08/27/2017  . S/P cesarean section: indication: arrest of descent  08/27/2017  . Postpartum care following cesarean delivery (11/19) 08/26/2017  . Encounter for elective induction of labor 08/25/2017  . Hyperthyroidism 12/31/2014  . H/O allergic urticaria 12/02/2014  . Atopic dermatitis 05/18/2014  . Allergic rhinitis 03/22/2014  . Low back pain 11/28/2012    Past Surgical History:  Procedure Laterality Date  . CESAREAN SECTION N/A 08/26/2017   Procedure: CESAREAN SECTION;  Surgeon: Maxie Better, MD;  Location: [facility] BIRTHING SUITES;  Service: Obstetrics;  Laterality: N/A;  . NO PAST SURGERIES      OB History    Gravida  1   Para      Term      Preterm      AB      Living        SAB      TAB      Ectopic      Multiple      Live Births               Home Medications    Prior to Admission medications   Medication Sig Start Date End Date Taking? Authorizing Provider  benzonatate (TESSALON) 100 MG capsule Take 1 capsule (100 mg total) by mouth every 8 (eight) hours. 08/15/18   Cathie Hoops, Jazziel Fitzsimmons V, PA-C  cetirizine-pseudoephedrine (ZYRTEC-D) 5-120 MG tablet Take 1 tablet by mouth daily. 06/08/18   Wurst, [place], PA-C  ferrous sulfate 325 (65 FE) MG tablet Take 1  tablet (325 mg total) by mouth 2 (two) times daily with a meal. Patient not taking: Reported on 06/08/2018 08/28/17   Karena Addison, CNM  fluticasone (FLONASE) 50 MCG/ACT nasal spray Place 2 sprays into both nostrils daily. 06/08/18   Wurst, [place], PA-C  ibuprofen (ADVIL,MOTRIN) 600 MG tablet Take 1 tablet (600 mg total) by mouth every 6 (six) hours. 08/28/17   Sigmon, Scarlette Slice, CNM  ipratropium (ATROVENT) 0.06 % nasal spray Place 2 sprays into both nostrils 4 (four) times daily. 08/15/18   Cathie Hoops, Trace Wirick V, PA-C  oxyCODONE-acetaminophen (PERCOCET/ROXICET) 5-325 MG tablet Take 1 tablet by mouth every 4 (four) hours as needed (pain scale 4-7). Patient not taking: Reported on 06/08/2018 08/28/17   Karena Addison, CNM  predniSONE (DELTASONE) 50 MG tablet Take 1 tablet (50 mg total) by mouth daily. 08/15/18   Belinda Fisher, PA-C  Prenatal Vit-Fe Fumarate-FA (PRENATAL MULTIVITAMIN) TABS tablet Take 1 tablet by mouth daily at 12 noon.    [provider]    Family History Family History  Problem Relation Age of Onset  . Dementia Maternal Grandmother   . Heart attack Paternal Grandmother   . Asthma Neg Hx   .  Diabetes Neg Hx   . Heart disease Neg Hx   . Hyperlipidemia Neg Hx   . Hypertension Neg Hx     Social History Social History   Tobacco Use  . Smoking status: Passive Smoke Exposure - Never Smoker  . Smokeless tobacco: Never Used  . Tobacco comment: Pt states no current smokers in household  Substance Use Topics  . Alcohol use: No    Alcohol/week: 0.0 standard drinks  . Drug use: No     Allergies   Bee venom   Review of Systems Review of Systems  Reason unable to perform ROS: See HPI as above.     Physical Exam Triage Vital Signs ED Triage Vitals [08/15/18 1947]  Enc Vitals Group     BP 117/77     Pulse Rate 75     Resp 18     Temp 98.4 F (36.9 C)     Temp Source Oral     SpO2 96 %     Weight      Height      Head Circumference      Peak Flow      Pain  Score 5     Pain Loc      Pain Edu?      Excl. in GC?    No data found.  Updated Vital Signs BP 117/77 (BP Location: Right Arm)   Pulse 75   Temp 98.4 F (36.9 C) (Oral)   Resp 18   SpO2 96%   Physical Exam  Constitutional: She is oriented to person, place, and time. She appears well-developed and well-nourished. No distress.  HENT:  Head: Normocephalic and atraumatic.  Right Ear: Tympanic membrane, external ear and ear canal normal. Tympanic membrane is not erythematous and not bulging.  Left Ear: Tympanic membrane, external ear and ear canal normal. Tympanic membrane is not erythematous and not bulging.  Nose: Nose normal. Right sinus exhibits no maxillary sinus tenderness and no frontal sinus tenderness. Left sinus exhibits no maxillary sinus tenderness and no frontal sinus tenderness.  Mouth/Throat: Uvula is midline, oropharynx is clear and moist and mucous membranes are normal. No tonsillar exudate.  Eyes: Pupils are equal, round, and reactive to light. Conjunctivae are normal.  Neck: Normal range of motion. Neck supple.  Cardiovascular: Normal rate, regular rhythm and normal heart sounds. Exam reveals no gallop and no friction rub.  No murmur heard. Pulmonary/Chest: Effort normal and breath sounds normal. She has no decreased breath sounds. She has no wheezes. She has no rhonchi. She has no rales.  Lymphadenopathy:    She has no cervical adenopathy.  Neurological: She is alert and oriented to person, place, and time.  Skin: Skin is warm and dry.  Psychiatric: She has a normal mood and affect. Her behavior is normal. Judgment normal.     UC Treatments / Results  Labs (all labs ordered are listed, but only abnormal results are displayed) Labs Reviewed  CULTURE, GROUP A STREP [facility])  POCT RAPID STREP A    EKG None  Radiology No results found.  Procedures Procedures (including critical care time)  Medications Ordered in UC Medications - No data to  display  Initial Impression / Assessment and Plan / UC Course  I have reviewed the triage vital signs and the nursing notes.  Pertinent labs & imaging results that were available during my care of the patient were reviewed by me and considered in my medical decision making (see chart for details).  Discussed with patient, most likely viral illness causing laryngitis.  Prednisone as directed.  Other symptomatic treatment discussed.  Push fluids.  Return precautions given.  Patient expresses understanding and agrees to plan.  Final Clinical Impressions(s) / UC Diagnoses   Final diagnoses:  Laryngitis    ED Prescriptions    Medication Sig Dispense Auth. Provider   predniSONE (DELTASONE) 50 MG tablet Take 1 tablet (50 mg total) by mouth daily. 5 tablet Maelyn Berrey V, PA-C   ipratropium (ATROVENT) 0.06 % nasal spray Place 2 sprays into both nostrils 4 (four) times daily. 15 mL Anastasya Jewell V, PA-C   benzonatate (TESSALON) 100 MG capsule Take 1 capsule (100 mg total) by mouth every 8 (eight) hours. 21 capsule Threasa Alpha, [place] 08/15/18 2017

## 2018-08-15 NOTE — ED Triage Notes (Signed)
Pt here for sore throat and URI sx  

## 2018-08-15 NOTE — Discharge Instructions (Signed)
Prednisone for voice hoarseness. Tessalon for cough. Start fatrovent nasal spray for possible drainage down the throat. You can use over the counter nasal saline rinse such as neti pot for nasal congestion. Keep hydrated, your urine should be clear to pale yellow in color. Tylenol/motrin for fever and pain. Monitor for any worsening of symptoms, chest pain, shortness of breath, wheezing, swelling of the throat, follow up for reevaluation.   For sore throat/cough try using a honey-based tea. Use 3 teaspoons of honey with juice squeezed from half lemon. Place shaved pieces of ginger into 1/2-1 cup of water and warm over stove top. Then mix the ingredients and repeat every 4 hours as needed.

## 2018-08-19 LAB — CULTURE, GROUP A STREP (THRC)

## 2018-08-20 ENCOUNTER — Telehealth (HOSPITAL_COMMUNITY): Payer: Self-pay | Admitting: Emergency Medicine

## 2018-08-20 NOTE — Telephone Encounter (Signed)
Culture is positive for non group A Strep germ.  This is a finding of uncertain significance; not the typical 'strep throat' germ. Attempted to reach patient. No answer at this time. Voicemail left.    

## 2018-10-16 ENCOUNTER — Ambulatory Visit (INDEPENDENT_AMBULATORY_CARE_PROVIDER_SITE_OTHER): Payer: BLUE CROSS/BLUE SHIELD | Admitting: Family Medicine

## 2018-10-16 ENCOUNTER — Encounter: Payer: Self-pay | Admitting: Family Medicine

## 2018-10-16 VITALS — BP 94/72 | HR 96 | Temp 98.5°F | Ht 60.0 in | Wt 202.4 lb

## 2018-10-16 DIAGNOSIS — R6889 Other general symptoms and signs: Secondary | ICD-10-CM | POA: Diagnosis not present

## 2018-10-16 MED ORDER — OSELTAMIVIR PHOSPHATE 75 MG PO CAPS: 75.0000 mg | ORAL_CAPSULE | Freq: Two times a day (BID) | ORAL | 0 refills | Status: AC

## 2018-10-16 MED ORDER — OSELTAMIVIR PHOSPHATE 75 MG PO CAPS: 75.0000 mg | ORAL_CAPSULE | Freq: Two times a day (BID) | ORAL | 0 refills | Status: DC

## 2018-10-16 NOTE — Patient Instructions (Signed)
Drink plenty of fluids, especially water Rest Use nasal saline spray at least 3 times per day Take Tamiflu as directed Follow-up if symptoms worsen or do not improve in 7-10 days

## 2018-10-16 NOTE — Progress Notes (Signed)
Claire Schmidt is a 23 y.o. female  Chief Complaint  Patient presents with  . Cough    w/ sore throat    HPI: Claire Schmidt is a 23 y.o. female complains of cough, sore throat, headache, body aches, low grade temp 100.4. Symptoms began last night and worse this AM. Mild runny nose, nasal congestion.  Pt has been taking OTC cold & flu medication.   Sick contact - 1yo daughter diagnosed with flu 3 days ago. Pt did not have a flu vaccine.   Past Medical History:  Diagnosis Date  . Headache   . Medical history non-contributory     Past Surgical History:  Procedure Laterality Date  . CESAREAN SECTION N/A 08/26/2017   Procedure: CESAREAN SECTION;  Surgeon: Servando Salina, MD;  Location: [place];  Service: Obstetrics;  Laterality: N/A;  . NO PAST SURGERIES      Social History   Socioeconomic History  . Marital status: Single    Spouse name: Not on file  . Number of children: Not on file  . Years of education: Not on file  . Highest education level: Not on file  Occupational History  . Not on file  Social Needs  . Financial resource strain: Not on file  . Food insecurity:    Worry: Not on file    Inability: Not on file  . Transportation needs:    Medical: Not on file    Non-medical: Not on file  Tobacco Use  . Smoking status: Passive Smoke Exposure - Never Smoker  . Smokeless tobacco: Never Used  . Tobacco comment: Pt states no current smokers in household  Substance and Sexual Activity  . Alcohol use: No    Alcohol/week: 0.0 standard drinks  . Drug use: No  . Sexual activity: Yes  Lifestyle  . Physical activity:    Days per week: Not on file    Minutes per session: Not on file  . Stress: Not on file  Relationships  . Social connections:    Talks on phone: Not on file    Gets together: Not on file    Attends religious service: Not on file    Active member of club or organization: Not on file    Attends meetings of clubs or  organizations: Not on file    Relationship status: Not on file  . Intimate partner violence:    Fear of current or ex partner: Not on file    Emotionally abused: Not on file    Physically abused: Not on file    Forced sexual activity: Not on file  Other Topics Concern  . Not on file  Social History Narrative   Lives with mom and dad.  Middle of 3 girls. 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 Dementia Maternal Grandmother   . Heart attack Paternal Grandmother   . Asthma Neg Hx   . Diabetes Neg Hx   . Heart disease Neg Hx   . Hyperlipidemia Neg Hx   . Hypertension Neg Hx      Immunization History  Administered Date(s) Administered  . DTaP 01/18/1997, 03/18/1997, 05/18/1997, 06/22/1999, 04/16/2002  . Hepatitis A 08/15/2011  . Hepatitis A, Ped/Adol-2 Dose 12/29/2014  . Hepatitis B 08-04-1997, 12/18/1996, 05/18/1997  . HiB (PRP-OMP)  01/18/1997, 03/18/1997, 05/18/1997, 11/17/1997  . IPV 01/18/1997, 03/18/1997, 11/17/1997, 04/16/2002  . Influenza Split 08/07/2011, 08/22/2012  . Influenza-Unspecified 08/07/2011, 08/22/2012  . MMR 11/17/1997, 04/16/2002  . Meningococcal Conjugate 08/15/2011, 12/29/2014  . Td 01/22/2008  . Tdap 01/22/2008  . Varicella 06/22/1999, 08/15/2011    Outpatient Encounter Medications as of 10/16/2018  Medication Sig  . oseltamivir (TAMIFLU) 75 MG capsule Take 1 capsule (75 mg total) by mouth 2 (two) times daily for 5 days.  . Prenatal Vit-Fe Fumarate-FA (PRENATAL MULTIVITAMIN) TABS tablet Take 1 tablet by mouth daily at 12 noon.  . [DISCONTINUED] benzonatate (TESSALON) 100 MG capsule Take 1 capsule (100 mg total) by mouth every 8 (eight) hours. (Patient not taking: Reported on 10/16/2018)  . [DISCONTINUED] cetirizine-pseudoephedrine (ZYRTEC-D) 5-120 MG tablet Take 1 tablet by mouth daily. (Patient not taking:  Reported on 10/16/2018)  . [DISCONTINUED] ferrous sulfate 325 (65 FE) MG tablet Take 1 tablet (325 mg total) by mouth 2 (two) times daily with a meal. (Patient not taking: Reported on 06/08/2018)  . [DISCONTINUED] fluticasone (FLONASE) 50 MCG/ACT nasal spray Place 2 sprays into both nostrils daily. (Patient not taking: Reported on 10/16/2018)  . [DISCONTINUED] ibuprofen (ADVIL,MOTRIN) 600 MG tablet Take 1 tablet (600 mg total) by mouth every 6 (six) hours. (Patient not taking: Reported on 10/16/2018)  . [DISCONTINUED] ipratropium (ATROVENT) 0.06 % nasal spray Place 2 sprays into both nostrils 4 (four) times daily. (Patient not taking: Reported on 10/16/2018)  . [DISCONTINUED] oseltamivir (TAMIFLU) 75 MG capsule Take 1 capsule (75 mg total) by mouth 2 (two) times daily for 5 days.  . [DISCONTINUED] oxyCODONE-acetaminophen (PERCOCET/ROXICET) 5-325 MG tablet Take 1 tablet by mouth every 4 (four) hours as needed (pain scale 4-7). (Patient not taking: Reported on 06/08/2018)  . [DISCONTINUED] predniSONE (DELTASONE) 50 MG tablet Take 1 tablet (50 mg total) by mouth daily. (Patient not taking: Reported on 10/16/2018)   No facility-administered encounter medications on file as of 10/16/2018.      ROS: Pertinent positives and negatives noted in HPI. Remainder of ROS non-contributory  Allergies  Allergen Reactions  . Bee Venom Anaphylaxis    BP 94/72   Pulse 96   Temp 98.5 F (36.9 C) (Oral)   Ht 5' (1.524 m)   Wt 202 lb 6.4 oz (91.8 kg)   SpO2 100%   BMI 39.53 kg/m   Physical Exam  Constitutional: She is oriented to person, place, and time. She appears well-developed and well-nourished. No distress.  Appears fatigued, run-down  HENT:  Head: Normocephalic and atraumatic.  Right Ear: Tympanic membrane and ear canal normal.  Left Ear: Tympanic membrane and ear canal normal.  Nose: Mucosal edema and rhinorrhea present. Right sinus exhibits no maxillary sinus tenderness and no frontal sinus tenderness. Left  sinus exhibits no maxillary sinus tenderness and no frontal sinus tenderness.  Mouth/Throat: Uvula is midline and mucous membranes are normal. Posterior oropharyngeal erythema present. No oropharyngeal exudate or posterior oropharyngeal edema.  Eyes: Pupils are equal, round, and reactive to light. Conjunctivae and EOM are normal. Right eye exhibits no discharge. Left eye exhibits no discharge.  Neck: Neck supple.  Cardiovascular: Normal rate, regular rhythm and normal heart sounds.  Pulmonary/Chest: Effort normal and breath sounds normal. No respiratory distress. She has no wheezes. She has no rhonchi.  Lymphadenopathy:    She has no cervical adenopathy.  Neurological: She is alert and oriented to person, place, and time.     A/P:  1. Flu-like symptoms - we are out of rapid  strep tests and pt with known flu exposure (her 1yo daughter who tested positive 3 days ago) and symptoms c/w flu - cont supportive care to include increased fluids, rest, tylenol or ibuprofen PRN Rx: - oseltamivir (TAMIFLU) 75 MG capsule; Take 1 capsule (75 mg total) by mouth 2 (two) times daily for 5 days.  Dispense: 10 capsule; Refill: 0 - f/u if symptoms worsen or do not improve in 7-10 days - work note given Discussed plan and reviewed medications with patient, including risks, benefits, and potential side effects. Pt expressed understand. All questions answered.

## 2018-11-10 ENCOUNTER — Ambulatory Visit: Payer: Self-pay

## 2018-11-10 ENCOUNTER — Ambulatory Visit (INDEPENDENT_AMBULATORY_CARE_PROVIDER_SITE_OTHER): Payer: BLUE CROSS/BLUE SHIELD | Admitting: Family

## 2018-11-10 ENCOUNTER — Encounter: Payer: Self-pay | Admitting: Family

## 2018-11-10 VITALS — BP 107/70 | HR 65 | Temp 98.4°F | Resp 16 | Ht 60.0 in | Wt 199.0 lb

## 2018-11-10 DIAGNOSIS — L089 Local infection of the skin and subcutaneous tissue, unspecified: Secondary | ICD-10-CM | POA: Diagnosis not present

## 2018-11-10 MED ORDER — CEPHALEXIN 500 MG PO CAPS: 500.0000 mg | ORAL_CAPSULE | Freq: Three times a day (TID) | ORAL | 0 refills | Status: DC

## 2018-11-10 NOTE — Telephone Encounter (Signed)
Pt c/o navel pain for 1 week and last night pt's navel began draining yellow discharge. Pt having diarrhea, no other symptoms. Denies fever, vomiting or nausea. Coughing makes the pain worse. Pain is constant and pt rates it mild to moderate. Care advice given and pt verbalized understanding. Pt given appt today with PCP.  Reason for Disposition . [1] MODERATE pain (e.g., interferes with normal activities) AND [2] pain comes and goes (cramps) AND [3] present > 24 hours  (Exception: pain with Vomiting or Diarrhea - see that Guideline)  Answer Assessment - Initial Assessment Questions 1. LOCATION: "Where does it hurt?"      top of navel to the bottom of navel 2. RADIATION: "Does the pain shoot anywhere else?" (e.g., chest, back)     no 3. ONSET: "When did the pain begin?" (e.g., minutes, hours or days ago)      Last Wednesday 4. SUDDEN: "Gradual or sudden onset?"     Suddenly after coughing 5. PATTERN "Does the pain come and go, or is it constant?"    - If constant: "Is it getting better, staying the same, or worsening?"      (Note: Constant means the pain never goes away completely; most serious pain is constant and it progresses)     - If intermittent: "How long does it last?" "Do you have pain now?"     (Note: Intermittent means the pain goes away completely between bouts)     Comes and goes  stays45 minutes and hurts again with movement- yes 6. SEVERITY: "How bad is the pain?"  (e.g., Scale 1-10; mild, moderate, or severe)   - MILD (1-3): doesn't interfere with normal activities, abdomen soft and not tender to touch    - MODERATE (4-7): interferes with normal activities or awakens from sleep, tender to touch    - SEVERE (8-10): excruciating pain, doubled over, unable to do any normal activities      mild 7. RECURRENT SYMPTOM: "Have you ever had this type of abdominal pain before?" If so, ask: "When was the last time?" and "What happened that time?"      no 8. CAUSE: "What do you think is  causing the abdominal pain?"     Pt doesn't know but is having yellow drainage - drainage started last night 9. RELIEVING/AGGRAVATING FACTORS: "What makes it better or worse?" (e.g., movement, antacids, bowel movement)     Movement, coughing- makes it worse- nothing makes it better 10. OTHER SYMPTOMS: "Has there been any vomiting, diarrhea, constipation, or urine problems?"       diarrhea 11. PREGNANCY: "Is there any chance you are pregnant?" "When was your last menstrual period?"       No LMP:  January on Nexplanon implant  Protocols used: [facility]

## 2018-11-10 NOTE — Patient Instructions (Signed)
Please begin keflex three times daily (antibiotic). Call if increase pain, swelling, drainage or if symptoms do not improve in 3-4 days.

## 2018-11-10 NOTE — Progress Notes (Signed)
Subjective:    Patient ID: Claire Schmidt, female    DOB: [birth year], 23 y.o.   MRN: [id number]  HPI  Patient is a 23 yr old female who presents today with chief complaint of abdominal pain.  Reports pain around her umbilicus with some discharge which began yesterday.    Review of Systems    see HPI  Past Medical History:  Diagnosis Date  . Headache   . Medical history non-contributory      Social History   Socioeconomic History  . Marital status: Single    Spouse name: Not on file  . Number of children: Not on file  . Years of education: Not on file  . Highest education level: Not on file  Occupational History  . Not on file  Social Needs  . Financial resource strain: Not on file  . Food insecurity:    Worry: Not on file    Inability: Not on file  . Transportation needs:    Medical: Not on file    Non-medical: Not on file  Tobacco Use  . Smoking status: Passive Smoke Exposure - Never Smoker  . Smokeless tobacco: Never Used  . Tobacco comment: Pt states no current smokers in household  Substance and Sexual Activity  . Alcohol use: No    Alcohol/week: 0.0 standard drinks  . Drug use: No  . Sexual activity: Yes  Lifestyle  . Physical activity:    Days per week: Not on file    Minutes per session: Not on file  . Stress: Not on file  Relationships  . Social connections:    Talks on phone: Not on file    Gets together: Not on file    Attends religious service: Not on file    Active member of club or organization: Not on file    Attends meetings of clubs or organizations: Not on file    Relationship status: Not on file  . Intimate partner violence:    Fear of current or ex partner: Not on file    Emotionally abused: Not on file    Physically abused: Not on file    Forced sexual activity: Not on file  Other Topics Concern  . Not on file  Social History Narrative   Lives with mom and dad.  Middle of 3 girls. 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 CESAREAN SECTION N/A 08/26/2017   Procedure: CESAREAN SECTION;  Surgeon: Maxie Better, MD;  Location: [facility] BIRTHING SU[facility];  Service: Obstetrics;  Laterality: N/A;  . NO PAST SURGERIES      Family History  Problem Relation Age of Onset  . Dementia Maternal Grandmother   . Heart attack Paternal Grandmother   . Asthma Neg Hx   . Diabetes Neg Hx   . Heart disease Neg Hx   . Hyperlipidemia Neg Hx   . Hypertension Neg Hx     Allergies  Allergen Reactions  . Bee Venom Anaphylaxis    Current Outpatient Medications on File Prior to Visit  Medication Sig Dispense Refill  . Prenatal Vit-Fe Fumarate-FA (PRENATAL MULTIVITAMIN) TABS tablet Take 1 tablet by mouth daily at 12 noon.     No current facility-administered medications on file prior to visit.     BP 107/70 (BP Location: Left Arm,  Patient Position: Sitting, Cuff Size: Large)   Pulse 65   Temp 98.4 F (36.9 C) (Oral)   Resp 16   Ht 5' (1.524 m)   Wt 199 lb (90.3 kg)   LMP 10/10/2018   SpO2 100%   BMI 38.86 kg/m    Objective:   Physical Exam Constitutional:      Appearance: She is well-developed.  Neurological:     Mental Status: She is alert.   abdomen:  Tenderness of umbilicus with small ulcer noted within umbilicus, + tenderness, scant serous drainage. No surrounding erythema.          Assessment & Plan:  Skin infection- will rx with keflex, suspect infected cyst at the base of umbilicus.  If not improved in 1 week plan [place] for further evaluation.

## 2018-11-17 ENCOUNTER — Ambulatory Visit (INDEPENDENT_AMBULATORY_CARE_PROVIDER_SITE_OTHER): Payer: BLUE CROSS/BLUE SHIELD | Admitting: Family

## 2018-11-17 VITALS — BP 95/68 | HR 72 | Temp 98.6°F | Resp 16 | Wt 205.0 lb

## 2018-11-17 DIAGNOSIS — R1033 Periumbilical pain: Secondary | ICD-10-CM | POA: Diagnosis not present

## 2018-11-17 NOTE — Patient Instructions (Signed)
You should be contacted about scheduling your ultrasound. Call if you develop increased redness/swelling/drainage or pain from your belly button.

## 2018-11-17 NOTE — Progress Notes (Signed)
Subjective:    Patient ID: Claire Schmidt, female    DOB: 1996-02-19, 23 y.o.   MRN: [id number]  HPI  Patient is a 23 yr old female who presents today for follow up of her umbilical skin infection. She reports no recent drainage. Reports that pain has improved but not completely resolved.   Review of Systems See HPI  Past Medical History:  Diagnosis Date  . Headache   . Medical history non-contributory      Social History   Socioeconomic History  . Marital status: Single    Spouse name: Not on file  . Number of children: Not on file  . Years of education: Not on file  . Highest education level: Not on file  Occupational History  . Not on file  Social Needs  . Financial resource strain: Not on file  . Food insecurity:    Worry: Not on file    Inability: Not on file  . Transportation needs:    Medical: Not on file    Non-medical: Not on file  Tobacco Use  . Smoking status: Passive Smoke Exposure - Never Smoker  . Smokeless tobacco: Never Used  . Tobacco comment: Pt states no current smokers in household  Substance and Sexual Activity  . Alcohol use: No    Alcohol/week: 0.0 standard drinks  . Drug use: No  . Sexual activity: Yes  Lifestyle  . Physical activity:    Days per week: Not on file    Minutes per session: Not on file  . Stress: Not on file  Relationships  . Social connections:    Talks on phone: Not on file    Gets together: Not on file    Attends religious service: Not on file    Active member of club or organization: Not on file    Attends meetings of clubs or organizations: Not on file    Relationship status: Not on file  . Intimate partner violence:    Fear of current or ex partner: Not on file    Emotionally abused: Not on file    Physically abused: Not on file    Forced sexual activity: Not on file  Other Topics Concern  . Not on file  Social History Narrative   Lives with mom and dad.  Middle of 3 girls. 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 CESAREAN SECTION N/A 08/26/2017   Procedure: CESAREAN SECTION;  Surgeon: Maxie Better, MD;  Location: [facility]THING SUIT[facility];  Service: Obstetrics;  Laterality: N/A;  . NO PAST SURGERIES      Family History  Problem Relation Age of Onset  . Dementia Maternal Grandmother   . Heart attack Paternal Grandmother   . Asthma Neg Hx   . Diabetes Neg Hx   . Heart disease Neg Hx   . Hyperlipidemia Neg Hx   . Hypertension Neg Hx     Allergies  Allergen Reactions  . Bee Venom Anaphylaxis    No current outpatient medications on file prior to visit.   No current facility-administered medications on file prior to visit.     BP 95/68 (BP Location: Left Arm, Patient Position: Sitting, Cuff Size: Large)   Pulse 72   Temp 98.6 F (37 C) (Oral)   Resp 16  Wt 205 lb (93 kg)   SpO2 100%   BMI 40.04 kg/m       Objective:   Physical Exam Constitutional:      Appearance: Normal appearance.  Skin:    General: Skin is warm and dry.     Comments: No discharge noted from umbilicus. Mild tenderness deep beneath umbilicus. No erythema  Neurological:     Mental Status: She is alert.           Assessment & Plan:  Umbilical pain- improved but not resolved. She has completed abx. I recommended that she complete an US of her umbilicus to make sure she doesn't have any cyst beneath her umbilicus that may require surgical consultation.  Pt is advised as follows:  You should be contacted about scheduling your ultrasound. Call if you develop increased redness/swelling/drainage or pain from your belly button.

## 2018-11-20 ENCOUNTER — Ambulatory Visit (HOSPITAL_BASED_OUTPATIENT_CLINIC_OR_DEPARTMENT_OTHER)
Admission: RE | Admit: 2018-11-20 | Discharge: 2018-11-20 | Disposition: A | Discharge status: Home / Self Care | Payer: BLUE CROSS/BLUE SHIELD | Source: Ambulatory Visit | Attending: Family | Admitting: Family

## 2018-11-20 DIAGNOSIS — R1033 Periumbilical pain: Secondary | ICD-10-CM | POA: Insufficient documentation

## 2018-11-20 DIAGNOSIS — K439 Ventral hernia without obstruction or gangrene: Secondary | ICD-10-CM | POA: Diagnosis not present

## 2018-11-21 ENCOUNTER — Encounter: Payer: Self-pay | Admitting: Family

## 2018-11-21 ENCOUNTER — Telehealth: Payer: Self-pay

## 2018-11-21 DIAGNOSIS — K429 Umbilical hernia without obstruction or gangrene: Secondary | ICD-10-CM

## 2018-11-21 NOTE — Telephone Encounter (Signed)
Copied from CRM [id number]. Topic: General - Other >> Nov 21, 2018  3:52 PM Tamela Oddi wrote: Reason for CRM: Patient called to request to change her PCP because of location, to Dr. Beverely Low.  Please call patient to confirm that this will be ok.  CB# [phone]

## 2018-11-21 NOTE — Telephone Encounter (Signed)
Copied from CRM [id number]. Topic: General - Other >> Nov 21, 2018 12:10 PM Percival Spanish wrote:  Pt call to fup on her [place] results   Patient advised per Melissa US showed a small hernia and to continue to monitor pain, if worst to call us back. May need referral to [facility] for further evaluation. Patient will like to be referred to [facility] now due to pain x about 3 weeks, per Melissa ok to enter referral. [facility] referral entered.

## 2018-11-21 NOTE — Telephone Encounter (Signed)
Ok with me 

## 2018-11-21 NOTE — Telephone Encounter (Signed)
Unfortunately due to volume and scheduling, I am unable to accept at this time

## 2018-11-21 NOTE — Telephone Encounter (Signed)
Please advise 

## 2018-11-24 NOTE — Telephone Encounter (Signed)
Pt would like to see if Dr. Mardelle Matte will accept her as a new pt with her?

## 2018-11-24 NOTE — Telephone Encounter (Signed)
That is fine. Please schedule

## 2018-11-24 NOTE — Telephone Encounter (Signed)
Please inform Pt of Dr. Rennis Golden decision. Thank you.

## 2018-11-24 NOTE — Telephone Encounter (Signed)
Unable to to call pt, pt phone is set up not take incoming calls.

## 2018-12-11 ENCOUNTER — Encounter: Payer: Self-pay | Admitting: Family Medicine

## 2018-12-11 ENCOUNTER — Ambulatory Visit (INDEPENDENT_AMBULATORY_CARE_PROVIDER_SITE_OTHER): Payer: BLUE CROSS/BLUE SHIELD | Admitting: Family Medicine

## 2018-12-11 ENCOUNTER — Other Ambulatory Visit: Payer: Self-pay

## 2018-12-11 VITALS — BP 122/70 | HR 74 | Temp 98.6°F | Resp 14 | Ht 60.0 in | Wt 207.4 lb

## 2018-12-11 DIAGNOSIS — K439 Ventral hernia without obstruction or gangrene: Secondary | ICD-10-CM | POA: Diagnosis not present

## 2018-12-11 DIAGNOSIS — K5900 Constipation, unspecified: Secondary | ICD-10-CM | POA: Insufficient documentation

## 2018-12-11 DIAGNOSIS — Z975 Presence of (intrauterine) contraceptive device: Secondary | ICD-10-CM | POA: Insufficient documentation

## 2018-12-11 HISTORY — DX: Presence of (intrauterine) contraceptive device: Z97.5

## 2018-12-11 NOTE — Progress Notes (Signed)
Subjective  CC:  Chief Complaint  Patient presents with  . Establish Care    Umbilical pain, was told she has a hernia     HPI: Claire Schmidt is a 23 y.o. female who presents to [place] at [facility] today to establish care with me as a new patient.   She has the following concerns or needs:  Pleasant 23 year old G1, P1 who lives with her significant other and works for [employer] presents to establish care.  I reviewed recent notes.  Her main concern is midline abdominal pain that comes and goes, described as pressure but will last for several hours at times.  She has not noticed any pattern to it.  Unrelated to eating.  However she also admits to persistent constipation since pregnancy.  Her daughter is now-year-old.  Still hard to pass small firm bowel movement.  No melena.  No bright red blood per rectum consistently although she did have hemorrhoids with pregnancy.  Appetite is fine.  Weight is stable.  She denies symptoms of GERD or reflux.  No right upper or left upper quadrant pain.  Her abdominal pain was evaluated recently and an ultrasound showed a small ventral hernia superior to the umbilicus.  A general surgeon referral was placed but has not been completed.  Review of systems negative for fevers or chills, nausea or vomiting.  Birth control: Nexplanon  Health maintenance: Due for physical.  Declines influenza vaccination.  Assessment  1. Ventral hernia without obstruction or gangrene   2. Constipation, unspecified constipation type   3. Nexplanon in place - jan 2019      Plan   Ventral hernia with tenderness on exam.  We will follow-up on referral to general surgeon.  Would like further evaluation.  Education given.  See after visit summary.  Constipation, contributing to ventral hernia pain.  Start stool softeners and MiraLAX.  Discussed diet.  Follow-up if unimproved.  Health maintenance: Return for complete physical.  Follow up:  Return  in about 3 months (around 03/13/2019) for complete physical. No orders of the defined types were placed in this encounter.  No orders of the defined types were placed in this encounter.    Depression screen PHQ 2/9 11/17/2018  Decreased Interest 0  Down, Depressed, Hopeless 0  PHQ - 2 Score 0    We updated and reviewed the patient's past history in detail and it is documented below.  Patient Active Problem List   Diagnosis Date Noted  . Nexplanon in place - jan 2019 12/11/2018  . Ventral hernia without obstruction or gangrene 12/11/2018  . Constipation 12/11/2018  . H/O allergic urticaria 12/02/2014  . Atopic dermatitis 05/18/2014  . Allergic rhinitis 03/22/2014   Health Maintenance  Topic Date Due  . CHLAMYDIA SCREENING  12/25/2016  . INFLUENZA VACCINE  01/06/2019 (Originally 05/08/2018)  . PAP-Cervical Cytology Screening  10/08/2020  . TETANUS/TDAP  02/06/2027  . HIV Screening  Completed  . PAP SMEAR-Modifier  Discontinued   Immunization History  Administered Date(s) Administered  . DTaP 01/18/1997, 03/18/1997, 05/18/1997, 06/22/1999, 04/16/2002  . Hepatitis A 08/15/2011  . Hepatitis A, Ped/Adol-2 Dose 12/29/2014  . Hepatitis B 12/30/1996, 12/18/1996, 05/18/1997  . HiB (PRP-OMP) 01/18/1997, 03/18/1997, 05/18/1997, 11/17/1997  . IPV 01/18/1997, 03/18/1997, 11/17/1997, 04/16/2002  . Influenza Split 08/07/2011, 08/22/2012  . Influenza-Unspecified 08/07/2011, 08/22/2012  . MMR 11/17/1997, 04/16/2002  . Meningococcal Conjugate 08/15/2011, 12/29/2014  . Td 01/22/2008  . Tdap 01/22/2008, 02/05/2017  . Varicella 06/22/1999, 08/15/2011  Current Meds  Medication Sig  . etonogestrel (NEXPLANON) 68 MG IMPL implant 1 each by Subdermal route once.    Allergies: Patient is allergic to bee venom. Past Medical History Patient  has a past medical history of Nexplanon in place - jan 2019 (12/11/2018). Past Surgical History Patient  has a past surgical history that includes No past  surgeries and Cesarean section (N/A, 08/26/2017). Family History: Patient family history includes Dementia in her maternal grandmother; Heart attack in her paternal grandmother. Social History:  Patient  reports that she is a non-smoker but has been exposed to tobacco smoke. She has never used smokeless tobacco. She reports that she does not drink alcohol or use drugs.  Review of Systems: Constitutional: negative for fever or malaise Ophthalmic: negative for photophobia, double vision or loss of vision Cardiovascular: negative for chest pain, dyspnea on exertion, or new LE swelling Respiratory: negative for SOB or persistent cough Gastrointestinal: negative for  melena Genitourinary: negative for dysuria or gross hematuria Musculoskeletal: negative for new gait disturbance or muscular weakness Integumentary: negative for new or persistent rashes Neurological: negative for TIA or stroke symptoms Psychiatric: negative for SI or delusions Allergic/Immunologic: negative for hives  Patient Care Team    Relationship Specialty Notifications Start End  Leamon Arnt, MD PCP - General Family Medicine  12/11/18   Servando Salina, MD Consulting Physician Obstetrics and Gynecology  12/11/18     Objective  Vitals: BP 122/70   Pulse 74   Temp 98.6 F (37 C) (Oral)   Resp 14   Ht 5' (1.524 m)   Wt 207 lb 6.4 oz (94.1 kg)   SpO2 98%   BMI 40.51 kg/m  General:  Well developed, well nourished, no acute distress  Psych:  Alert and oriented,normal mood and affect HEENT:  Normocephalic, atraumatic, non-icteric sclera, PERRL, oropharynx is without mass or exudate, supple neck without adenopathy, mass or thyromegaly Cardiovascular:  RRR without gallop, rub or murmur, nondisplaced PMI Respiratory:  Good breath sounds bilaterally, CTAB with normal respiratory effort Gastrointestinal: normal bowel sounds, soft, no right upper quadrant tenderness, mild midepigastric tenderness without rebound or  guarding, small ventral hernia palpated and easily reduced, tenderness present. No HSM MSK: no deformities, contusions. Joints are without erythema or swelling Skin:  Warm, no rashes or suspicious lesions noted Neurologic:    Mental status is normal. Gross motor and sensory exams are normal. Normal gait   Commons side effects, risks, benefits, and alternatives for medications and treatment plan prescribed today were discussed, and the patient expressed understanding of the given instructions. Patient is instructed to call or message via MyChart if he/she has any questions or concerns regarding our treatment plan. No barriers to understanding were identified. We discussed Red Flag symptoms and signs in detail. Patient expressed understanding regarding what to do in case of urgent or emergency type symptoms.   Medication list was reconciled, printed and provided to the patient in AVS. Patient instructions and summary information was reviewed with the patient as documented in the AVS. This note was prepared with assistance of Dragon voice recognition software. Occasional wrong-word or sound-a-like substitutions may have occurred due to the inherent limitations of voice recognition software

## 2018-12-11 NOTE — Patient Instructions (Signed)
Please return in 1-6 months for your annual complete physical; please come fasting.  Start colace ( a stool softener) once or twice a day and Miralax daily until your bowel movements are softened and moving better. Then, decrease to twice a week as needed.  Start OTC zantac or pepcid daily as well.   We will call you with information regarding your referral appointment. General surgeon to evaluate your ventral hernia. If you do not hear from [place] within the next 2 weeks, please let me know. It can take 1-2 weeks to get appointments set up with the specialists.   It was a pleasure meeting you today! Thank you for choosing [place] to meet your healthcare needs! I truly look forward to working with you. If you have any questions or concerns, please send me a message via Mychart or call the office at [phone].   Ventral Hernia  A ventral hernia is a bulge of tissue from inside the abdomen that pushes through a weak area of the muscles that form the front wall of the abdomen. The tissues inside the abdomen are inside a sac (peritoneum). These tissues include the small intestine, large intestine, and the fatty tissue that covers the intestines (omentum). Sometimes, the bulge that forms a hernia contains intestines. Other hernias contain only fat. Ventral hernias do not go away without surgical treatment. There are several types of ventral hernias. You may have:  A hernia at an incision site from previous abdominal surgery (incisional hernia).  A hernia just above the belly button (epigastric hernia), or at the belly button (umbilical hernia). These types of hernias can develop from heavy lifting or straining.  A hernia that comes and goes (reducible hernia). It may be visible only when you lift or strain. This type of hernia can be pushed back into the abdomen (reduced).  A hernia that traps abdominal tissue inside the hernia (incarcerated hernia). This type of hernia does not reduce.  A hernia that  cuts off blood flow to the tissues inside the hernia (strangulated hernia). The tissues can start to die if this happens. This is a very painful bulge that cannot be reduced. A strangulated hernia is a medical emergency. What are the causes? This condition is caused by abdominal tissue putting pressure on an area of weakness in the abdominal muscles. What increases the risk? The following factors may make you more likely to develop this condition:  Being female.  Being 74 or older.  Being overweight or obese.  Having had previous abdominal surgery, especially if there was an infection after surgery.  Having had an injury to the abdominal wall.  Having had several pregnancies.  Having a buildup of fluid inside the abdomen (ascites). What are the signs or symptoms? The only symptom of a ventral hernia may be a painless bulge in the abdomen. A reducible hernia may be visible only when you strain, cough, or lift. Other symptoms may include:  Dull pain.  A feeling of pressure. Signs and symptoms of a strangulated hernia may include:  Increasing pain.  Nausea and vomiting.  Pain when pressing on the hernia.  The skin over the hernia turning red or purple.  Constipation.  Blood in the stool (feces). How is this diagnosed? This condition may be diagnosed based on:  Your symptoms.  Your medical history.  A physical exam. You may be asked to cough or strain while standing. These actions increase the pressure inside your abdomen and force the hernia through the opening  in your muscles. Your health care provider may try to reduce the hernia by pressing on it.  Imaging studies, such as an ultrasound or CT scan. How is this treated? This condition is treated with surgery. If you have a strangulated hernia, surgery is done as soon as possible. If your hernia is small and not incarcerated, you may be asked to lose some weight before surgery. Follow these instructions at  home:  Follow instructions from your health care provider about eating or drinking restrictions.  If you are overweight, your health care provider may recommend that you increase your activity level and eat a healthier diet.  Do not lift anything that is heavier than 10 lb (4.5 kg).  Return to your normal activities as told by your health care provider. Ask your health care provider what activities are safe for you. You may need to avoid activities that increase pressure on your hernia.  Take over-the-counter and prescription medicines only as told by your health care provider.  Keep all follow-up visits as told by your health care provider. This is important. Contact a health care provider if:  Your hernia gets larger.  Your hernia becomes painful. Get help right away if:  Your hernia becomes increasingly painful.  You have pain along with any of the following: ? Changes in skin color in the area of the hernia. ? Nausea. ? Vomiting. ? Fever. Summary  A ventral hernia is a bulge of tissue from inside the abdomen that pushes through a weak area of the muscles that form the front wall of the abdomen.  This condition is treated with surgery, which may be urgent depending on your hernia.  Do not lift anything that is heavier than 10 lb (4.5 kg), and follow activity instructions from your health care provider. This information is not intended to replace advice given to you by your health care provider. Make sure you discuss any questions you have with your health care provider. Document Released: 09/10/2012 Document Revised: 11/06/2017 Document Reviewed: 04/15/2017 Elsevier Interactive Patient Education  2019 [employer].  Constipation, Adult Constipation is when a person has fewer bowel movements in a week than normal, has difficulty having a bowel movement, or has stools that are dry, hard, or larger than normal. Constipation may be caused by an underlying condition. It may  become worse with age if a person takes certain medicines and does not take in enough fluids. Follow these instructions at home: Eating and drinking   Eat foods that have a lot of fiber, such as fresh fruits and vegetables, whole grains, and beans.  Limit foods that are high in fat, low in fiber, or overly processed, such as french fries, hamburgers, cookies, candies, and soda.  Drink enough fluid to keep your urine clear or pale yellow. General instructions  Exercise regularly or as told by your health care provider.  Go to the restroom when you have the urge to go. Do not hold it in.  Take over-the-counter and prescription medicines only as told by your health care provider. These include any fiber supplements.  Practice pelvic floor retraining exercises, such as deep breathing while relaxing the lower abdomen and pelvic floor relaxation during bowel movements.  Watch your condition for any changes.  Keep all follow-up visits as told by your health care provider. This is important. Contact a health care provider if:  You have pain that gets worse.  You have a fever.  You do not have a bowel movement after  4 days.  You vomit.  You are not hungry.  You lose weight.  You are bleeding from the anus.  You have thin, pencil-like stools. Get help right away if:  You have a fever and your symptoms suddenly get worse.  You leak stool or have blood in your stool.  Your abdomen is bloated.  You have severe pain in your abdomen.  You feel dizzy or you faint. This information is not intended to replace advice given to you by your health care provider. Make sure you discuss any questions you have with your health care provider. Document Released: 06/22/2004 Document Revised: 04/13/2016 Document Reviewed: 03/14/2016 Elsevier Interactive Patient Education  2019 [employer].

## 2019-02-06 ENCOUNTER — Encounter: Payer: Medicaid Other | Admitting: Family Medicine

## 2019-04-27 ENCOUNTER — Other Ambulatory Visit (HOSPITAL_COMMUNITY)
Admission: RE | Admit: 2019-04-27 | Discharge: 2019-04-27 | Disposition: A | Discharge status: Home / Self Care | Payer: BC Managed Care – PPO | Source: Ambulatory Visit | Attending: Family Medicine | Admitting: Family Medicine

## 2019-04-27 ENCOUNTER — Encounter: Payer: Self-pay | Admitting: Family Medicine

## 2019-04-27 ENCOUNTER — Ambulatory Visit (INDEPENDENT_AMBULATORY_CARE_PROVIDER_SITE_OTHER): Payer: BC Managed Care – PPO | Admitting: Family Medicine

## 2019-04-27 ENCOUNTER — Other Ambulatory Visit: Payer: Self-pay

## 2019-04-27 VITALS — BP 122/68 | HR 78 | Temp 98.2°F | Resp 16 | Ht 60.0 in | Wt 211.2 lb

## 2019-04-27 DIAGNOSIS — Z0000 Encounter for general adult medical examination without abnormal findings: Secondary | ICD-10-CM | POA: Insufficient documentation

## 2019-04-27 LAB — CBC WITH DIFFERENTIAL/PLATELET
Basophils Absolute: 0 10*3/uL (ref 0.0–0.1)
Basophils Relative: 0.4 % (ref 0.0–3.0)
Eosinophils Absolute: 0.4 10*3/uL (ref 0.0–0.7)
Eosinophils Relative: 4.8 % (ref 0.0–5.0)
HCT: 38.6 % (ref 36.0–46.0)
Hemoglobin: 13 g/dL (ref 12.0–15.0)
Lymphocytes Relative: 38.2 % (ref 12.0–46.0)
Lymphs Abs: 3.4 10*3/uL (ref 0.7–4.0)
MCHC: 33.6 g/dL (ref 30.0–36.0)
MCV: 86.6 fl (ref 78.0–100.0)
Monocytes Absolute: 0.7 10*3/uL (ref 0.1–1.0)
Monocytes Relative: 7.9 % (ref 3.0–12.0)
Neutro Abs: 4.4 10*3/uL (ref 1.4–7.7)
Neutrophils Relative %: 48.7 % (ref 43.0–77.0)
Platelets: 339 10*3/uL (ref 150.0–400.0)
RBC: 4.45 Mil/uL (ref 3.87–5.11)
RDW: 13.3 % (ref 11.5–15.5)
WBC: 9 10*3/uL (ref 4.0–10.5)

## 2019-04-27 LAB — COMPREHENSIVE METABOLIC PANEL
ALT: 16 U/L (ref 0–35)
AST: 15 U/L (ref 0–37)
Albumin: 3.9 g/dL (ref 3.5–5.2)
Alkaline Phosphatase: 60 U/L (ref 39–117)
BUN: 16 mg/dL (ref 6–23)
CO2: 23 mEq/L (ref 19–32)
Calcium: 8.9 mg/dL (ref 8.4–10.5)
Chloride: 105 mEq/L (ref 96–112)
Creatinine, Ser: 0.87 mg/dL (ref 0.40–1.20)
GFR: 98.12 mL/min (ref >60.00)
Glucose, Bld: 92 mg/dL (ref 70–99)
Potassium: 4.1 mEq/L (ref 3.5–5.1)
Sodium: 138 mEq/L (ref 135–145)
Total Bilirubin: 0.4 mg/dL (ref 0.2–1.2)
Total Protein: 6.4 g/dL (ref 6.0–8.3)

## 2019-04-27 LAB — LIPID PANEL
Cholesterol: 181 mg/dL (ref 0–200)
HDL: 37.1 mg/dL — ABNORMAL LOW (ref >39.00)
LDL Cholesterol: 121 mg/dL — ABNORMAL HIGH (ref 0–99)
NonHDL: 143.94
Total CHOL/HDL Ratio: 5
Triglycerides: 114 mg/dL (ref 0.0–149.0)
VLDL: 22.8 mg/dL (ref 0.0–40.0)

## 2019-04-27 NOTE — Progress Notes (Signed)
Subjective  Chief Complaint  Patient presents with  . Annual Exam    She is fasting    HPI: Claire Schmidt is a 22 y.o. female who presents to [facility] [place] today for a Female Wellness Visit.   Wellness Visit: annual visit with health maintenance review and exam without Pap   Doing great. Working from home. No concerns. nexplanon for birth control with irregular cycles. Sees gyn.   Diet and exercise: discussed weight loss and exercise program.   Assessment  1. Annual physical exam      Plan  Female Wellness Visit:  Age appropriate Health Maintenance and Prevention measures were discussed with patient. Included topics are cancer screening recommendations, ways to keep healthy (see AVS) including dietary and exercise recommendations, regular eye and dental care, use of seat belts, and avoidance of moderate alcohol use and tobacco use.   BMI: discussed patient's BMI and encouraged positive lifestyle modifications to help get to or maintain a target BMI.  HM needs and immunizations were addressed and ordered. See below for orders. See HM and immunization section for updates.  Routine labs and screening tests ordered including cmp, cbc and lipids where appropriate.  Discussed recommendations regarding Vit D and calcium supplementation (see AVS)  Follow up: Return in about 1 year (around 04/26/2020) for complete physical.   Orders Placed This Encounter  Procedures  . CBC with Differential/Platelet  . Comprehensive metabolic panel  . Lipid panel  . HIV Antibody (routine testing w rflx)  urine gc/chlamydia  No orders of the defined types were placed in this encounter.     Lifestyle: Body mass index is 41.25 kg/m. Wt Readings from Last 3 Encounters:  04/27/19 211 lb 3.2 oz (95.8 kg)  12/11/18 207 lb 6.4 oz (94.1 kg)  11/17/18 205 lb (93 kg)    Need for contraception: nexplanon  Patient Active Problem List   Diagnosis Date Noted  .  Nexplanon in place - jan 2019 12/11/2018  . Ventral hernia without obstruction or gangrene 12/11/2018  . Constipation 12/11/2018  . H/O allergic urticaria 12/02/2014  . Atopic dermatitis 05/18/2014  . Allergic rhinitis 03/22/2014   Health Maintenance  Topic Date Due  . CHLAMYDIA SCREENING  12/25/2016  . INFLUENZA VACCINE  05/09/2019  . PAP-Cervical Cytology Screening  10/08/2020  . TETANUS/TDAP  02/06/2027  . HIV Screening  Completed  . PAP SMEAR-Modifier  Discontinued   Immunization History  Administered Date(s) Administered  . DTaP 01/18/1997, 03/18/1997, 05/18/1997, 06/22/1999, 04/16/2002  . Hepatitis A 08/15/2011  . Hepatitis A, Ped/Adol-2 Dose 12/29/2014  . Hepatitis B 06/26/1997, 12/18/1996, 05/18/1997  . HiB (PRP-OMP) 01/18/1997, 03/18/1997, 05/18/1997, 11/17/1997  . IPV 01/18/1997, 03/18/1997, 11/17/1997, 04/16/2002  . Influenza Split 08/07/2011, 08/22/2012  . Influenza-Unspecified 08/07/2011, 08/22/2012  . MMR 11/17/1997, 04/16/2002  . Meningococcal Conjugate 08/15/2011, 12/29/2014  . Td 01/22/2008  . Tdap 01/22/2008, 02/05/2017  . Varicella 06/22/1999, 08/15/2011   We updated and reviewed the patient's past history in detail and it is documented below. Allergies: Patient is allergic to bee venom. Past Medical History Patient  has a past medical history of Nexplanon in place - jan 2019 (12/11/2018). Past Surgical History Patient  has a past surgical history that includes No past surgeries and Cesarean section (N/A, 08/26/2017). Family History: Patient family history includes Dementia in her maternal grandmother; Heart attack in her paternal grandmother. Social History:  Patient  reports that she is a non-smoker but has been exposed to tobacco smoke. She  has never used smokeless tobacco. She reports that she does not drink alcohol or use drugs.  Review of Systems: Constitutional: negative for fever or malaise Ophthalmic: negative for photophobia, double vision or  loss of vision Cardiovascular: negative for chest pain, dyspnea on exertion, or new LE swelling Respiratory: negative for SOB or persistent cough Gastrointestinal: negative for abdominal pain, change in bowel habits or melena Genitourinary: negative for dysuria or gross hematuria, no abnormal uterine bleeding or disharge Musculoskeletal: negative for new gait disturbance or muscular weakness Integumentary: negative for new or persistent rashes, no breast lumps Neurological: negative for TIA or stroke symptoms Psychiatric: negative for SI or delusions Allergic/Immunologic: negative for hives Patient Care Team    Relationship Specialty Notifications Start End  Leamon Arnt, MD PCP - General Family Medicine  12/11/18   Servando Salina, MD Consulting Physician Obstetrics and Gynecology  12/11/18     Objective  Vitals: BP 122/68   Pulse 78   Temp 98.2 F (36.8 C) (Oral)   Resp 16   Ht 5' (1.524 m)   Wt 211 lb 3.2 oz (95.8 kg)   SpO2 100%   BMI 41.25 kg/m  General:  Well developed, well nourished, no acute distress  Psych:  Alert and orientedx3,normal mood and affect HEENT:  Normocephalic, atraumatic, non-icteric sclera, PERRL, oropharynx is clear without mass or exudate, supple neck without adenopathy, mass or thyromegaly Cardiovascular:  Normal S1, S2, RRR without gallop, rub or murmur, nondisplaced PMI Respiratory:  Good breath sounds bilaterally, CTAB with normal respiratory effort Gastrointestinal: normal bowel sounds, soft, non-tender, no noted masses. No HSM MSK: no deformities, contusions. Joints are without erythema or swelling. Spine and CVA region are nontender Skin:  Warm, no rashes or suspicious lesions noted Neurologic:    Mental status is normal. CN 2-11 are normal. Gross motor and sensory exams are normal. Normal gait. No tremor    Commons side effects, risks, benefits, and alternatives for medications and treatment plan prescribed today were discussed, and the  patient expressed understanding of the given instructions. Patient is instructed to call or message via MyChart if he/she has any questions or concerns regarding our treatment plan. No barriers to understanding were identified. We discussed Red Flag symptoms and signs in detail. Patient expressed understanding regarding what to do in case of urgent or emergency type symptoms.   Medication list was reconciled, printed and provided to the patient in AVS. Patient instructions and summary information was reviewed with the patient as documented in the AVS. This note was prepared with assistance of Dragon voice recognition software. Occasional wrong-word or sound-a-like substitutions may have occurred due to the inherent limitations of voice recognition software

## 2019-04-27 NOTE — Patient Instructions (Signed)
Please return in 12 months for your annual complete physical; please come fasting.  I will release your lab results to you on your MyChart account with further instructions. Please reply with any questions.    If you have any questions or concerns, please don't hesitate to send me a message via MyChart or call the office at [phone]. Thank you for visiting with [place] today! It's our pleasure caring for you.   Preventive Care 22-22 Years Old, Female Preventive care refers to visits with your health care provider and lifestyle choices that can promote health and wellness. This includes:  A yearly physical exam. This may also be called an annual well check.  Regular dental visits and eye exams.  Immunizations.  Screening for certain conditions.  Healthy lifestyle choices, such as eating a healthy diet, getting regular exercise, not using drugs or products that contain nicotine and tobacco, and limiting alcohol use. What can I expect for my preventive care visit? Physical exam Your health care provider will check your:  Height and weight. This may be used to calculate body mass index (BMI), which tells if you are at a healthy weight.  Heart rate and blood pressure.  Skin for abnormal spots. Counseling Your health care provider may ask you questions about your:  Alcohol, tobacco, and drug use.  Emotional well-being.  Home and relationship well-being.  Sexual activity.  Eating habits.  Work and work Statistician.  Method of birth control.  Menstrual cycle.  Pregnancy history. What immunizations do I need?  Influenza (flu) vaccine  This is recommended every year. Tetanus, diphtheria, and pertussis (Tdap) vaccine  You may need a Td booster every 10 years. Varicella (chickenpox) vaccine  You may need this if you have not been vaccinated. Human papillomavirus (HPV) vaccine  If recommended by your health care provider, you may need three doses over 6 months.  Measles, mumps, and rubella (MMR) vaccine  You may need at least one dose of MMR. You may also need a second dose. Meningococcal conjugate (MenACWY) vaccine  One dose is recommended if you are age 22-21 years and a first-year college student living in a residence hall, or if you have one of several medical conditions. You may also need additional booster doses. Pneumococcal conjugate (PCV13) vaccine  You may need this if you have certain conditions and were not previously vaccinated. Pneumococcal polysaccharide (PPSV23) vaccine  You may need one or two doses if you smoke cigarettes or if you have certain conditions. Hepatitis A vaccine  You may need this if you have certain conditions or if you travel or work in places where you may be exposed to hepatitis A. Hepatitis B vaccine  You may need this if you have certain conditions or if you travel or work in places where you may be exposed to hepatitis B. Haemophilus influenzae type b (Hib) vaccine  You may need this if you have certain conditions. You may receive vaccines as individual doses or as more than one vaccine together in one shot (combination vaccines). Talk with your health care provider about the risks and benefits of combination vaccines. What tests do I need?  Blood tests  Lipid and cholesterol levels. These may be checked every 5 years starting at age 22.  Hepatitis C test.  Hepatitis B test. Screening  Diabetes screening. This is done by checking your blood sugar (glucose) after you have not eaten for a while (fasting).  Sexually transmitted disease (STD) testing.  BRCA-related cancer screening. This may  be done if you have a family history of breast, ovarian, tubal, or peritoneal cancers.  Pelvic exam and Pap test. This may be done every 3 years starting at age 21. Starting at age 30, this may be done every 5 years if you have a Pap test in combination with an HPV test. Talk with your health care provider about  your test results, treatment options, and if necessary, the need for more tests. Follow these instructions at home: Eating and drinking   Eat a diet that includes fresh fruits and vegetables, whole grains, lean protein, and low-fat dairy.  Take vitamin and mineral supplements as recommended by your health care provider.  Do not drink alcohol if: ? Your health care provider tells you not to drink. ? You are pregnant, may be pregnant, or are planning to become pregnant.  If you drink alcohol: ? Limit how much you have to 0-1 drink a day. ? Be aware of how much alcohol is in your drink. In the [place] one drink equals one 12 oz bottle of beer (355 mL), one 5 oz glass of wine (148 mL), or one 1 oz glass of hard liquor (44 mL). Lifestyle  Take daily care of your teeth and gums.  Stay active. Exercise for at least 30 minutes on 5 or more days each week.  Do not use any products that contain nicotine or tobacco, such as cigarettes, e-cigarettes, and chewing tobacco. If you need help quitting, ask your health care provider.  If you are sexually active, practice safe sex. Use a condom or other form of birth control (contraception) in order to prevent pregnancy and STIs (sexually transmitted infections). If you plan to become pregnant, see your health care provider for a preconception visit. What's next?  Visit your health care provider once a year for a well check visit.  Ask your health care provider how often you should have your eyes and teeth checked.  Stay up to date on all vaccines. This information is not intended to replace advice given to you by your health care provider. Make sure you discuss any questions you have with your health care provider. Document Released: 11/20/2001 Document Revised: 06/05/2018 Document Reviewed: 06/05/2018 Elsevier Patient Education  2020 [employer]   

## 2019-04-28 LAB — URINE CYTOLOGY ANCILLARY ONLY
Chlamydia: NEGATIVE
Neisseria Gonorrhea: NEGATIVE

## 2019-04-28 LAB — HIV ANTIBODY (ROUTINE TESTING W REFLEX): HIV 1&2 Ab, 4th Generation: NONREACTIVE

## 2019-05-25 ENCOUNTER — Encounter: Payer: Self-pay | Admitting: Family Medicine

## 2019-05-25 DIAGNOSIS — Z20828 Contact with and (suspected) exposure to other viral communicable diseases: Secondary | ICD-10-CM | POA: Diagnosis not present

## 2019-06-01 ENCOUNTER — Ambulatory Visit (INDEPENDENT_AMBULATORY_CARE_PROVIDER_SITE_OTHER): Payer: BC Managed Care – PPO | Admitting: Family Medicine

## 2019-06-01 ENCOUNTER — Ambulatory Visit (INDEPENDENT_AMBULATORY_CARE_PROVIDER_SITE_OTHER): Payer: BC Managed Care – PPO

## 2019-06-01 ENCOUNTER — Encounter: Payer: Self-pay | Admitting: Family Medicine

## 2019-06-01 VITALS — BP 122/82 | HR 72 | Temp 97.6°F | Resp 16 | Wt 211.0 lb

## 2019-06-01 DIAGNOSIS — K219 Gastro-esophageal reflux disease without esophagitis: Secondary | ICD-10-CM | POA: Diagnosis not present

## 2019-06-01 DIAGNOSIS — R0609 Other forms of dyspnea: Secondary | ICD-10-CM | POA: Diagnosis not present

## 2019-06-01 DIAGNOSIS — R5383 Other fatigue: Secondary | ICD-10-CM | POA: Diagnosis not present

## 2019-06-01 DIAGNOSIS — R0781 Pleurodynia: Secondary | ICD-10-CM | POA: Diagnosis not present

## 2019-06-01 DIAGNOSIS — R0602 Shortness of breath: Secondary | ICD-10-CM | POA: Diagnosis not present

## 2019-06-01 DIAGNOSIS — G44209 Tension-type headache, unspecified, not intractable: Secondary | ICD-10-CM

## 2019-06-01 DIAGNOSIS — R079 Chest pain, unspecified: Secondary | ICD-10-CM | POA: Diagnosis not present

## 2019-06-01 LAB — CBC WITH DIFFERENTIAL/PLATELET
Basophils Absolute: 0.1 10*3/uL (ref 0.0–0.1)
Basophils Relative: 0.7 % (ref 0.0–3.0)
Eosinophils Absolute: 0.4 10*3/uL (ref 0.0–0.7)
Eosinophils Relative: 6.2 % — ABNORMAL HIGH (ref 0.0–5.0)
HCT: 39.5 % (ref 36.0–46.0)
Hemoglobin: 13.1 g/dL (ref 12.0–15.0)
Lymphocytes Relative: 43.1 % (ref 12.0–46.0)
Lymphs Abs: 3 10*3/uL (ref 0.7–4.0)
MCHC: 33.2 g/dL (ref 30.0–36.0)
MCV: 87.1 fl (ref 78.0–100.0)
Monocytes Absolute: 0.5 10*3/uL (ref 0.1–1.0)
Monocytes Relative: 7.6 % (ref 3.0–12.0)
Neutro Abs: 2.9 10*3/uL (ref 1.4–7.7)
Neutrophils Relative %: 42.4 % — ABNORMAL LOW (ref 43.0–77.0)
Platelets: 354 10*3/uL (ref 150.0–400.0)
RBC: 4.53 Mil/uL (ref 3.87–5.11)
RDW: 13.4 % (ref 11.5–15.5)
WBC: 6.9 10*3/uL (ref 4.0–10.5)

## 2019-06-01 MED ORDER — OMEPRAZOLE 20 MG PO CPDR: 20.0000 mg | DELAYED_RELEASE_CAPSULE | Freq: Every day | ORAL | 3 refills | Status: DC

## 2019-06-01 NOTE — Progress Notes (Signed)
Subjective  CC:  Chief Complaint  Patient presents with  . Form Completion  . URI    Reports that she is still having the chest soreness, cough, and headaches.. Has tried Tylenol and Advil with minimal relief    HPI: Claire Schmidt is a 23 y.o. female who presents to the office today to address the problems listed above in the chief complaint.  23 year old with 3-week history of daily bitemporal headaches, pleuritic chest pain with minimal cough and fatigue.  States it started after traveling out of town due to her family members illness travel occurred twice in the road because the family member passed.  She has been unable to feel better.  Now also having upper midepigastric pain which she contributes to her hernia.  She does suffer from constipation.  She has had no fevers, chills, sweats, loss of taste or smell, nausea, vomiting, diarrhea, productive cough, calf pain or lower extremity swelling.  She has been out of work for the last week due to the COVID testing.  She has paperwork needs to be completed so she may return.  She complains of being fatigued.  No history of clotting disorders.  No hematemesis.  She has not been aware of significant stress but admits that travel and the recent loss of her fianc's aunt could be contributing.  [facility], negative results are in the chart. Assessment  1. Pleuritic chest pain   2. DOE (dyspnea on exertion)   3. Other fatigue   4. Gastroesophageal reflux disease, esophagitis presence not specified   5. Tension headache      Plan   Pleuritic chest pain with dyspnea on exertion and fatigue: Normal vitals.  Normal lung exam.  Doubt PE but will check chest x-ray and d-dimer.  Question if headaches heaviness in the chest are more stress related.  Counseling done.  Suspect upper abdominal pain due to GERD or gastritis.  Start omeprazole.  Discussed tension headaches and treatment.  See AVS.   FMLA paperwork completed.  Follow up: Return if symptoms worsen or fail to improve.  Visit date not found  Orders Placed This Encounter  Procedures  . DG Chest 2 View  . CBC with Differential/Platelet  . D-dimer, quantitative (not at [facility])  . TSH   Meds ordered this encounter  Medications  . omeprazole (PRILOSEC) 20 MG capsule    Sig: Take 1 capsule (20 mg total) by mouth daily.    Dispense:  30 capsule    Refill:  3      I reviewed the patients updated PMH, FH, and SocHx.    Patient Active Problem List   Diagnosis Date Noted  . Nexplanon in place - jan 2019 12/11/2018  . Ventral hernia without obstruction or gangrene 12/11/2018  . Constipation 12/11/2018  . H/O allergic urticaria 12/02/2014  . Atopic dermatitis 05/18/2014  . Allergic rhinitis 03/22/2014   Current Meds  Medication Sig  . etonogestrel (NEXPLANON) 68 MG IMPL implant 1 each by Subdermal route once.    Allergies: Patient is allergic to bee venom. Family History: Patient family history includes Dementia in her maternal grandmother; Heart attack in her paternal grandmother. Social History:  Patient  reports that she is a non-smoker but has been exposed to tobacco smoke. She has never used smokeless tobacco. She reports that she does not drink alcohol or use drugs.  Review of Systems: Constitutional: Negative for fever malaise or anorexia  Cardiovascular: negative for chest pain Respiratory: negative for SOB or persistent cough Gastrointestinal: negative for abdominal pain  Objective  Vitals: BP 122/82   Pulse 72   Temp 97.6 F (36.4 C) (Tympanic)   Resp 16   Wt 211 lb (95.7 kg)   SpO2 98%   BMI 41.21 kg/m  General: no acute distress , A&Ox3, appears well.  Normal speech HEENT: PEERL, conjunctiva normal, Oropharynx moist,neck is supple, no lymphadenopathy Cardiovascular:  RRR without murmur or gallop.  Respiratory:  Good breath sounds bilaterally, CTAB with normal respiratory effort, no  tachypnea Gastrointestinal: soft, flat abdomen, normal active bowel sounds, no palpable masses, no hepatosplenomegaly, no appreciated hernias Minimal epigastric tenderness without rebound or guarding Skin:  Warm, no rashes    Commons side effects, risks, benefits, and alternatives for medications and treatment plan prescribed today were discussed, and the patient expressed understanding of the given instructions. Patient is instructed to call or message via MyChart if he/she has any questions or concerns regarding our treatment plan. No barriers to understanding were identified. We discussed Red Flag symptoms and signs in detail. Patient expressed understanding regarding what to do in case of urgent or emergency type symptoms.   Medication list was reconciled, printed and provided to the patient in AVS. Patient instructions and summary information was reviewed with the patient as documented in the AVS. This note was prepared with assistance of Dragon voice recognition software. Occasional wrong-word or sound-a-like substitutions may have occurred due to the inherent limitations of voice recognition software

## 2019-06-01 NOTE — Patient Instructions (Addendum)
Please follow up if symptoms do not improve or as needed.   I will release your lab results to you on your MyChart account with further instructions. Please reply with any questions.   Start the omeprazole daily for your stomach pain.   Tension Headache, Adult A tension headache is a feeling of pain, pressure, or aching in the head that is often felt over the front and sides of the head. The pain can be dull, or it can feel tight (constricting). There are two types of tension headache:  Episodic tension headache. This is when the headaches happen fewer than 15 days a month.  Chronic tension headache. This is when the headaches happen more than 15 days a month during a 5652-month period. A tension headache can last from 30 minutes to several days. It is the most common kind of headache. Tension headaches are not normally associated with nausea or vomiting, and they do not get worse with physical activity. What are the causes? The exact cause of this condition is not known. Tension headaches are often triggered by stress, anxiety, or depression. Other triggers include:  Alcohol.  Too much caffeine or caffeine withdrawal.  Respiratory infections, such as colds, flu, or sinus infections.  Dental problems or teeth clenching.  Tiredness (fatigue).  Holding your head and neck in the same position for a long period of time, such as while using a computer.  Smoking.  Arthritis of the neck. What are the signs or symptoms? Symptoms of this condition include:  A feeling of pressure or tightness around the head.  Dull, aching head pain.  Pain over the front and sides of the head.  Tenderness in the muscles of the head, neck, and shoulders. How is this diagnosed? This condition may be diagnosed based on your symptoms, your medical history, and a physical exam. If your symptoms are severe or unusual, you may have imaging tests, such as a CT scan or an MRI of your head. Your vision may also  be checked. How is this treated? This condition may be treated with lifestyle changes and with medicines that help relieve symptoms. Follow these instructions at home: Managing pain  Take over-the-counter and prescription medicines only as told by your health care provider.  When you have a headache, lie down in a dark, quiet room.  If directed, apply ice to the head and neck: ? Put ice in a plastic bag. ? Place a towel between your skin and the bag. ? Leave the ice on for 20 minutes, 2-3 times a day.  If directed, apply heat to the back of your neck as often as told by your health care provider. Use the heat source that your health care provider recommends, such as a moist heat pack or a heating pad. ? Place a towel between your skin and the heat source. ? Leave the heat on for 20-30 minutes. ? Remove the heat if your skin turns bright red. This is especially important if you are unable to feel pain, heat, or cold. You may have a greater risk of getting burned. Eating and drinking  Eat meals on a regular schedule.  Limit alcohol intake to no more than 1 drink a day for nonpregnant women and 2 drinks a day for men. One drink equals 12 oz of beer, 5 oz of wine, or 1 oz of hard liquor.  Drink enough fluid to keep your urine pale yellow.  Decrease your caffeine intake, or stop using caffeine. Lifestyle  Get 7-9 hours of sleep each night, or get the amount of sleep recommended by your health care provider.  At bedtime, remove all electronic devices from your room. Electronic devices include computers, phones, and tablets.  Find ways to manage your stress. Some things that can help relieve stress include: ? Exercise. ? Deep breathing exercises. ? Yoga. ? Listening to music. ? Positive mental imagery.  Try to sit up straight and avoid tensing your muscles.  Do not use any products that contain nicotine or tobacco, such as cigarettes and e-cigarettes. If you need help quitting,  ask your health care provider. General instructions   Keep all follow-up visits as told by your health care provider. This is important.  Avoid any headache triggers. Keep a headache journal to help find out what may trigger your headaches. For example, write down: ? What you eat and drink. ? How much sleep you get. ? Any change to your diet or medicines. Contact a health care provider if:  Your headache does not get better.  Your headache comes back.  You are sensitive to sounds, light, or smells because of a headache.  You have nausea or you vomit.  Your stomach hurts. Get help right away if:  You suddenly develop a very severe headache along with any of the following: ? A stiff neck. ? Nausea and vomiting. ? Confusion. ? Weakness. ? Double vision or loss of vision. ? Shortness of breath. ? Rash. ? Unusual sleepiness. ? Fever. ? Trouble speaking. ? Pain in your eyes or ears. ? Trouble walking or balancing. ? Feeling faint or passing out. Summary  A tension headache is a feeling of pain, pressure, or aching in the head that is often felt over the front and sides of the head.  A tension headache can last from 30 minutes to several days. It is the most common kind of headache.  This condition may be diagnosed based on your symptoms, your medical history, and a physical exam.  This condition may be treated with lifestyle changes and with medicines that help relieve symptoms. This information is not intended to replace advice given to you by your health care provider. Make sure you discuss any questions you have with your health care provider. Document Released: 09/24/2005 Document Revised: 09/06/2017 Document Reviewed: 01/04/2017 Elsevier Patient Education  2020 [employer].

## 2019-06-02 LAB — D-DIMER, QUANTITATIVE: D-Dimer, Quant: 0.63 mcg/mL FEU — ABNORMAL HIGH (ref <0.50)

## 2019-06-02 LAB — TSH: TSH: 0.83 u[IU]/mL (ref 0.35–4.50)

## 2019-07-09 DIAGNOSIS — F4323 Adjustment disorder with mixed anxiety and depressed mood: Secondary | ICD-10-CM | POA: Diagnosis not present

## 2019-08-26 ENCOUNTER — Ambulatory Visit: Payer: Self-pay | Admitting: Surgery

## 2019-08-26 DIAGNOSIS — K436 Other and unspecified ventral hernia with obstruction, without gangrene: Secondary | ICD-10-CM | POA: Diagnosis not present

## 2019-08-26 NOTE — H&P (Signed)
Surgical H&P  CC: abdominal pain  HPI: This is a very pleasant and otherwise healthy 23 year old woman has been experiencing pain secondary to a supraumbilical chronically incarcerated hernia is about February of this year.  The pain has worsened recently, radiating to the left upper quadrant and contributing to worsening constipation.  Denies nausea, vomiting or decreased appetite.  Prior surgery includes a C-section via low transverse incision. She works for [employer] and is currently working from home.  Has a 40-year-old child to keep up with at home. Denies tobacco use.  Allergies  Allergen Reactions  . Bee Venom Anaphylaxis    Past Medical History:  Diagnosis Date  . Nexplanon in place - jan 2019 12/11/2018    Past Surgical History:  Procedure Laterality Date  . CESAREAN SECTION N/A 08/26/2017   Procedure: CESAREAN SECTION;  Surgeon: Maxie Better, MD;  Location: [facility]ING [facility]IT[facility];  Service: Obstetrics;  Laterality: N/A;  . NO PAST SURGERIES      Family History  Problem Relation Age of Onset  . Dementia Maternal Grandmother   . Heart attack Paternal Grandmother   . Asthma Neg Hx   . Diabetes Neg Hx   . Heart disease Neg Hx   . Hyperlipidemia Neg Hx   . Hypertension Neg Hx     Social History   Socioeconomic History  . Marital status: Significant Other    Spouse name: Not on file  . Number of children: 1  . Years of education: Not on file  . Highest education level: Not on file  Occupational History    Employer: [employer]  Social Needs  . Financial resource strain: Not on file  . Food insecurity    Worry: Not on file    Inability: Not on file  . Transportation needs    Medical: Not on file    Non-medical: Not on file  Tobacco Use  . Smoking status: Passive Smoke Exposure - Never Smoker  . Smokeless tobacco: Never Used  . Tobacco comment: Pt states no current smokers in household  Substance and Sexual Activity  . Alcohol use: No    Alcohol/week: 0.0  standard drinks  . Drug use: No  . Sexual activity: Yes  Lifestyle  . Physical activity    Days per week: Not on file    Minutes per session: Not on file  . Stress: Not on file  Relationships  . Social Musician on phone: Not on file    Gets together: Not on file    Attends religious service: Not on file    Active member of club or organization: Not on file    Attends meetings of clubs or organizations: Not on file    Relationship status: Not on file  Other Topics Concern  . Not on file  Social History Narrative   Lives with boyfriend and daughter.  Middle of 3 girls. 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 etonogestrel (NEXPLANON) 68 MG IMPL implant 1 each by Subdermal route once.    Marland Kitchen omeprazole (PRILOSEC) 20 MG capsule Take 1 capsule (20 mg total) by mouth daily. 30 capsule 3   No current facility-administered medications on file prior to visit.     Review of Systems: a complete, 10pt review of systems was completed  with pertinent positives and negatives as documented in the HPI  Physical Exam: Vitals  Weight: 215.5 lb Height: 61in Body Surface Area: 1.95 m Body Mass Index: 40.72 kg/m  Temp.: 97.34F  Pulse: 136 (Regular)  P.OX: 97% (Room air) BP: 118/74 (Sitting, Left Arm, Standard)  Gen: alert and well appearing Eye: extraocular motion intact, no scleral icterus ENT: moist mucus membranes, dentition intact Neck: no mass or thyromegaly Chest: unlabored respirations, symmetrical air entry, clear bilaterally CV: regular rate and rhythm, no pedal edema Abdomen: soft, nontender, nondistended. Small lobule of chronically incarcerated fat just superior to the umbilicus. No other palpable abdominal wall defects or hernias. MSK: strength symmetrical throughout, no deformity Neuro:  grossly intact, normal gait Psych: normal mood and affect, appropriate insight Skin: warm and dry, no rash or lesion on limited exam   CBC Latest Ref Rng & Units 06/01/2019 04/27/2019 08/27/2017  WBC 4.0 - 10.5 K/uL 6.9 9.0 15.2(H)  Hemoglobin 12.0 - 15.0 g/dL 13.1 13.0 9.5(L)  Hematocrit 36.0 - 46.0 % 39.5 38.6 28.6(L)  Platelets 150.0 - 400.0 K/uL 354.0 339.0 268    CMP Latest Ref Rng & Units 04/27/2019 08/15/2017 02/19/2017  Glucose 70 - 99 mg/dL 92 140(H) 97  BUN 6 - 23 mg/dL 16 8 6   Creatinine 0.40 - 1.20 mg/dL 0.87 0.64 0.50  Sodium 135 - 145 mEq/L 138 134(L) 137  Potassium 3.5 - 5.1 mEq/L 4.1 3.6 3.9  Chloride 96 - 112 mEq/L 105 107 105  CO2 19 - 32 mEq/L 23 18(L) -  Calcium 8.4 - 10.5 mg/dL 8.9 8.7(L) -  Total Protein 6.0 - 8.3 g/dL 6.4 6.4(L) -  Total Bilirubin 0.2 - 1.2 mg/dL 0.4 0.4 -  Alkaline Phos 39 - 117 U/L 60 162(H) -  AST 0 - 37 U/L 15 24 -  ALT 0 - 35 U/L 16 19 -    No results found for: INR, PROTIME  Imaging: No results found.   A/P: INCARCERATED VENTRAL HERNIA (K43.6) Story: Just superior to the umbilicus. Increasing symptomatic. Discussed options of open versus laparoscopic repair. Given the small size of the hernia recommend open approach, discussed possible use of mesh depending on size of fascial defect. Discussed risks of bleeding, infection, pain, scarring, injury to intra-abdominal structures, hernia recurrence, failure to resolve constipation issues, and systemic risks including cardiac, pulmonary, vascular, thromboembolic complications. Discussed option of nonoperative treatment with risks of worsening symptoms, increasing size of hernia, bowel incarceration extremely unlikely. Questions were welcomed and answered to her satisfaction. She would like to go ahead and schedule surgery.  Patient Active Problem List   Diagnosis Date Noted  . Nexplanon in place - jan 2019 12/11/2018  . Ventral hernia without obstruction or gangrene 12/11/2018  .  Constipation 12/11/2018  . H/O allergic urticaria 12/02/2014  . Atopic dermatitis 05/18/2014  . Allergic rhinitis 03/22/2014       Romana Juniper, MD [facility]  See AMION to contact appropriate on-call provider

## 2019-08-26 NOTE — H&P (View-Only) (Signed)
Surgical H&P  CC: abdominal pain  HPI: This is a very pleasant and otherwise healthy 23 year old woman has been experiencing pain secondary to a supraumbilical chronically incarcerated hernia is about February of this year.  The pain has worsened recently, radiating to the left upper quadrant and contributing to worsening constipation.  Denies nausea, vomiting or decreased appetite.  Prior surgery includes a C-section via low transverse incision. She works for [employer] and is currently working from home.  Has a 40-year-old child to keep up with at home. Denies tobacco use.  Allergies  Allergen Reactions  . Bee Venom Anaphylaxis    Past Medical History:  Diagnosis Date  . Nexplanon in place - jan 2019 12/11/2018    Past Surgical History:  Procedure Laterality Date  . CESAREAN SECTION N/A 08/26/2017   Procedure: CESAREAN SECTION;  Surgeon: Maxie Better, MD;  Location: [facility]IRTHING SUIT[facility];  Service: Obstetrics;  Laterality: N/A;  . NO PAST SURGERIES      Family History  Problem Relation Age of Onset  . Dementia Maternal Grandmother   . Heart attack Paternal Grandmother   . Asthma Neg Hx   . Diabetes Neg Hx   . Heart disease Neg Hx   . Hyperlipidemia Neg Hx   . Hypertension Neg Hx     Social History   Socioeconomic History  . Marital status: Significant Other    Spouse name: Not on file  . Number of children: 1  . Years of education: Not on file  . Highest education level: Not on file  Occupational History    Employer: [employer]  Social Needs  . Financial resource strain: Not on file  . Food insecurity    Worry: Not on file    Inability: Not on file  . Transportation needs    Medical: Not on file    Non-medical: Not on file  Tobacco Use  . Smoking status: Passive Smoke Exposure - Never Smoker  . Smokeless tobacco: Never Used  . Tobacco comment: Pt states no current smokers in household  Substance and Sexual Activity  . Alcohol use: No    Alcohol/week: 0.0  standard drinks  . Drug use: No  . Sexual activity: Yes  Lifestyle  . Physical activity    Days per week: Not on file    Minutes per session: Not on file  . Stress: Not on file  Relationships  . Social Musician on phone: Not on file    Gets together: Not on file    Attends religious service: Not on file    Active member of club or organization: Not on file    Attends meetings of clubs or organizations: Not on file    Relationship status: Not on file  Other Topics Concern  . Not on file  Social History Narrative   Lives with boyfriend and daughter.  Middle of 3 girls. 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 etonogestrel (NEXPLANON) 68 MG IMPL implant 1 each by Subdermal route once.    Marland Kitchen omeprazole (PRILOSEC) 20 MG capsule Take 1 capsule (20 mg total) by mouth daily. 30 capsule 3   No current facility-administered medications on file prior to visit.     Review of Systems: a complete, 10pt review of systems was completed  with pertinent positives and negatives as documented in the HPI  Physical Exam: Vitals  Weight: 215.5 lb Height: 61in Body Surface Area: 1.95 m Body Mass Index: 40.72 kg/m  Temp.: 97.34F  Pulse: 136 (Regular)  P.OX: 97% (Room air) BP: 118/74 (Sitting, Left Arm, Standard)  Gen: alert and well appearing Eye: extraocular motion intact, no scleral icterus ENT: moist mucus membranes, dentition intact Neck: no mass or thyromegaly Chest: unlabored respirations, symmetrical air entry, clear bilaterally CV: regular rate and rhythm, no pedal edema Abdomen: soft, nontender, nondistended. Small lobule of chronically incarcerated fat just superior to the umbilicus. No other palpable abdominal wall defects or hernias. MSK: strength symmetrical throughout, no deformity Neuro:  grossly intact, normal gait Psych: normal mood and affect, appropriate insight Skin: warm and dry, no rash or lesion on limited exam   CBC Latest Ref Rng & Units 06/01/2019 04/27/2019 08/27/2017  WBC 4.0 - 10.5 K/uL 6.9 9.0 15.2(H)  Hemoglobin 12.0 - 15.0 g/dL 13.1 13.0 9.5(L)  Hematocrit 36.0 - 46.0 % 39.5 38.6 28.6(L)  Platelets 150.0 - 400.0 K/uL 354.0 339.0 268    CMP Latest Ref Rng & Units 04/27/2019 08/15/2017 02/19/2017  Glucose 70 - 99 mg/dL 92 140(H) 97  BUN 6 - 23 mg/dL 16 8 6   Creatinine 0.40 - 1.20 mg/dL 0.87 0.64 0.50  Sodium 135 - 145 mEq/L 138 134(L) 137  Potassium 3.5 - 5.1 mEq/L 4.1 3.6 3.9  Chloride 96 - 112 mEq/L 105 107 105  CO2 19 - 32 mEq/L 23 18(L) -  Calcium 8.4 - 10.5 mg/dL 8.9 8.7(L) -  Total Protein 6.0 - 8.3 g/dL 6.4 6.4(L) -  Total Bilirubin 0.2 - 1.2 mg/dL 0.4 0.4 -  Alkaline Phos 39 - 117 U/L 60 162(H) -  AST 0 - 37 U/L 15 24 -  ALT 0 - 35 U/L 16 19 -    No results found for: INR, PROTIME  Imaging: No results found.   A/P: INCARCERATED VENTRAL HERNIA (K43.6) Story: Just superior to the umbilicus. Increasing symptomatic. Discussed options of open versus laparoscopic repair. Given the small size of the hernia recommend open approach, discussed possible use of mesh depending on size of fascial defect. Discussed risks of bleeding, infection, pain, scarring, injury to intra-abdominal structures, hernia recurrence, failure to resolve constipation issues, and systemic risks including cardiac, pulmonary, vascular, thromboembolic complications. Discussed option of nonoperative treatment with risks of worsening symptoms, increasing size of hernia, bowel incarceration extremely unlikely. Questions were welcomed and answered to her satisfaction. She would like to go ahead and schedule surgery.  Patient Active Problem List   Diagnosis Date Noted  . Nexplanon in place - jan 2019 12/11/2018  . Ventral hernia without obstruction or gangrene 12/11/2018  .  Constipation 12/11/2018  . H/O allergic urticaria 12/02/2014  . Atopic dermatitis 05/18/2014  . Allergic rhinitis 03/22/2014       Romana Juniper, MD [facility]  See AMION to contact appropriate on-call provider

## 2019-09-08 ENCOUNTER — Other Ambulatory Visit: Payer: Self-pay

## 2019-09-08 ENCOUNTER — Encounter (HOSPITAL_BASED_OUTPATIENT_CLINIC_OR_DEPARTMENT_OTHER): Payer: Self-pay | Admitting: *Deleted

## 2019-09-10 ENCOUNTER — Other Ambulatory Visit (HOSPITAL_COMMUNITY)
Admission: RE | Admit: 2019-09-10 | Discharge: 2019-09-10 | Disposition: A | Discharge status: Home / Self Care | Payer: BC Managed Care – PPO | Source: Ambulatory Visit | Attending: Surgery | Admitting: Surgery

## 2019-09-10 DIAGNOSIS — Z01812 Encounter for preprocedural laboratory examination: Secondary | ICD-10-CM | POA: Diagnosis not present

## 2019-09-10 DIAGNOSIS — Z20828 Contact with and (suspected) exposure to other viral communicable diseases: Secondary | ICD-10-CM | POA: Insufficient documentation

## 2019-09-11 NOTE — Progress Notes (Signed)

## 2019-09-13 LAB — NOVEL CORONAVIRUS, NAA (HOSP ORDER, SEND-OUT TO REF LAB; TAT 18-24 HRS): SARS-CoV-2, NAA: NOT DETECTED

## 2019-09-14 ENCOUNTER — Ambulatory Visit (HOSPITAL_BASED_OUTPATIENT_CLINIC_OR_DEPARTMENT_OTHER)
Admission: RE | Admit: 2019-09-14 | Discharge: 2019-09-14 | Disposition: A | Discharge status: Home / Self Care | Payer: BC Managed Care – PPO | Source: Ambulatory Visit | Attending: Surgery | Admitting: Surgery

## 2019-09-14 ENCOUNTER — Encounter (HOSPITAL_BASED_OUTPATIENT_CLINIC_OR_DEPARTMENT_OTHER)
Admission: RE | Disposition: A | Discharge status: Home / Self Care | Payer: Self-pay | Source: Ambulatory Visit | Attending: Surgery

## 2019-09-14 ENCOUNTER — Ambulatory Visit (HOSPITAL_BASED_OUTPATIENT_CLINIC_OR_DEPARTMENT_OTHER): Payer: BC Managed Care – PPO | Admitting: Anesthesiology

## 2019-09-14 ENCOUNTER — Other Ambulatory Visit: Payer: Self-pay

## 2019-09-14 ENCOUNTER — Encounter (HOSPITAL_BASED_OUTPATIENT_CLINIC_OR_DEPARTMENT_OTHER): Payer: Self-pay | Admitting: Anesthesiology

## 2019-09-14 DIAGNOSIS — Z6841 Body Mass Index (BMI) 40.0 and over, adult: Secondary | ICD-10-CM | POA: Diagnosis not present

## 2019-09-14 DIAGNOSIS — M6208 Separation of muscle (nontraumatic), other site: Secondary | ICD-10-CM | POA: Diagnosis not present

## 2019-09-14 DIAGNOSIS — I96 Gangrene, not elsewhere classified: Secondary | ICD-10-CM | POA: Diagnosis not present

## 2019-09-14 DIAGNOSIS — Z793 Long term (current) use of hormonal contraceptives: Secondary | ICD-10-CM | POA: Insufficient documentation

## 2019-09-14 DIAGNOSIS — K436 Other and unspecified ventral hernia with obstruction, without gangrene: Secondary | ICD-10-CM | POA: Diagnosis not present

## 2019-09-14 DIAGNOSIS — Z79899 Other long term (current) drug therapy: Secondary | ICD-10-CM | POA: Diagnosis not present

## 2019-09-14 DIAGNOSIS — K420 Umbilical hernia with obstruction, without gangrene: Secondary | ICD-10-CM | POA: Diagnosis not present

## 2019-09-14 DIAGNOSIS — J309 Allergic rhinitis, unspecified: Secondary | ICD-10-CM | POA: Diagnosis not present

## 2019-09-14 HISTORY — DX: Ventral hernia without obstruction or gangrene: K43.9

## 2019-09-14 HISTORY — PX: UMBILICAL HERNIA REPAIR: SHX196

## 2019-09-14 LAB — POCT PREGNANCY, URINE: Preg Test, Ur: NEGATIVE

## 2019-09-14 SURGERY — REPAIR, HERNIA, UMBILICAL, ADULT
Anesthesia: General | Site: Abdomen

## 2019-09-14 MED ORDER — OXYCODONE HCL 5 MG PO TABS: 5.0000 mg | ORAL_TABLET | ORAL | Status: DC | PRN

## 2019-09-14 MED ORDER — PHENYLEPHRINE HCL (PRESSORS) 10 MG/ML IV SOLN
INTRAVENOUS | Status: DC | PRN
  Administered 2019-09-14 (×2): 80 ug via INTRAVENOUS

## 2019-09-14 MED ORDER — CEFAZOLIN SODIUM-DEXTROSE 2-4 GM/100ML-% IV SOLN
2.0000 g | INTRAVENOUS | Status: AC
  Administered 2019-09-14: 12:00:00 2 g via INTRAVENOUS

## 2019-09-14 MED ORDER — MIDAZOLAM HCL 5 MG/5ML IJ SOLN
INTRAMUSCULAR | Status: DC | PRN
  Administered 2019-09-14: 2 mg via INTRAVENOUS

## 2019-09-14 MED ORDER — PHENYLEPHRINE 40 MCG/ML (10ML) SYRINGE FOR IV PUSH (FOR BLOOD PRESSURE SUPPORT)
PREFILLED_SYRINGE | INTRAVENOUS | Status: AC
  Filled 2019-09-14: qty 10

## 2019-09-14 MED ORDER — HYDROMORPHONE HCL 1 MG/ML IJ SOLN: 0.2500 mg | INTRAMUSCULAR | Status: DC | PRN

## 2019-09-14 MED ORDER — CHLORHEXIDINE GLUCONATE CLOTH 2 % EX PADS: 6.0000 | MEDICATED_PAD | Freq: Once | CUTANEOUS | Status: DC

## 2019-09-14 MED ORDER — LIDOCAINE 2% (20 MG/ML) 5 ML SYRINGE
INTRAMUSCULAR | Status: AC
  Filled 2019-09-14: qty 5

## 2019-09-14 MED ORDER — PROPOFOL 10 MG/ML IV BOLUS
INTRAVENOUS | Status: DC | PRN
  Administered 2019-09-14: 150 mg via INTRAVENOUS

## 2019-09-14 MED ORDER — OXYCODONE HCL 5 MG/5ML PO SOLN: 5.0000 mg | Freq: Once | ORAL | Status: DC | PRN

## 2019-09-14 MED ORDER — FENTANYL CITRATE (PF) 100 MCG/2ML IJ SOLN: 25.0000 ug | INTRAMUSCULAR | Status: DC | PRN

## 2019-09-14 MED ORDER — EPHEDRINE SULFATE 50 MG/ML IJ SOLN
INTRAMUSCULAR | Status: DC | PRN
  Administered 2019-09-14 (×2): 10 mg via INTRAVENOUS

## 2019-09-14 MED ORDER — MEPERIDINE HCL 25 MG/ML IJ SOLN: 6.2500 mg | INTRAMUSCULAR | Status: DC | PRN

## 2019-09-14 MED ORDER — ACETAMINOPHEN 500 MG PO TABS
1000.0000 mg | ORAL_TABLET | ORAL | Status: AC
  Administered 2019-09-14: 1000 mg via ORAL

## 2019-09-14 MED ORDER — CEFAZOLIN SODIUM-DEXTROSE 2-4 GM/100ML-% IV SOLN
INTRAVENOUS | Status: AC
  Filled 2019-09-14: qty 100

## 2019-09-14 MED ORDER — ROCURONIUM BROMIDE 100 MG/10ML IV SOLN
INTRAVENOUS | Status: DC | PRN
  Administered 2019-09-14: 100 mg via INTRAVENOUS

## 2019-09-14 MED ORDER — PROMETHAZINE HCL 25 MG/ML IJ SOLN: 6.2500 mg | INTRAMUSCULAR | Status: DC | PRN

## 2019-09-14 MED ORDER — FENTANYL CITRATE (PF) 100 MCG/2ML IJ SOLN
INTRAMUSCULAR | Status: AC
  Filled 2019-09-14: qty 2

## 2019-09-14 MED ORDER — BUPIVACAINE LIPOSOME 1.3 % IJ SUSP: 20.0000 mL | Freq: Once | INTRAMUSCULAR | Status: DC

## 2019-09-14 MED ORDER — OXYCODONE HCL 5 MG PO TABS: 5.0000 mg | ORAL_TABLET | Freq: Once | ORAL | Status: DC | PRN

## 2019-09-14 MED ORDER — SODIUM CHLORIDE 0.9% FLUSH: 3.0000 mL | INTRAVENOUS | Status: DC | PRN

## 2019-09-14 MED ORDER — ONDANSETRON HCL 4 MG/2ML IJ SOLN
INTRAMUSCULAR | Status: DC | PRN
  Administered 2019-09-14: 4 mg via INTRAVENOUS

## 2019-09-14 MED ORDER — ACETAMINOPHEN 325 MG PO TABS: 650.0000 mg | ORAL_TABLET | ORAL | Status: DC | PRN

## 2019-09-14 MED ORDER — LACTATED RINGERS IV SOLN
INTRAVENOUS | Status: DC
  Administered 2019-09-14 (×2): via INTRAVENOUS

## 2019-09-14 MED ORDER — SODIUM CHLORIDE 0.9 % IV SOLN: 250.0000 mL | INTRAVENOUS | Status: DC | PRN

## 2019-09-14 MED ORDER — OXYCODONE HCL 5 MG PO TABS: 5.0000 mg | ORAL_TABLET | Freq: Three times a day (TID) | ORAL | 0 refills | Status: DC | PRN

## 2019-09-14 MED ORDER — GABAPENTIN 300 MG PO CAPS
300.0000 mg | ORAL_CAPSULE | ORAL | Status: AC
  Administered 2019-09-14: 300 mg via ORAL

## 2019-09-14 MED ORDER — SODIUM CHLORIDE 0.9% FLUSH: 3.0000 mL | Freq: Two times a day (BID) | INTRAVENOUS | Status: DC

## 2019-09-14 MED ORDER — MIDAZOLAM HCL 2 MG/2ML IJ SOLN
INTRAMUSCULAR | Status: AC
  Filled 2019-09-14: qty 2

## 2019-09-14 MED ORDER — LIDOCAINE-EPINEPHRINE 1 %-1:100000 IJ SOLN
INTRAMUSCULAR | Status: DC | PRN
  Administered 2019-09-14: 20 mL

## 2019-09-14 MED ORDER — LIDOCAINE HCL (CARDIAC) PF 100 MG/5ML IV SOSY
PREFILLED_SYRINGE | INTRAVENOUS | Status: DC | PRN
  Administered 2019-09-14: 100 mg via INTRAVENOUS

## 2019-09-14 MED ORDER — DEXAMETHASONE SODIUM PHOSPHATE 4 MG/ML IJ SOLN
INTRAMUSCULAR | Status: DC | PRN
  Administered 2019-09-14: 10 mg via INTRAVENOUS

## 2019-09-14 MED ORDER — LIDOCAINE-EPINEPHRINE 1 %-1:100000 IJ SOLN
INTRAMUSCULAR | Status: AC
  Filled 2019-09-14: qty 1

## 2019-09-14 MED ORDER — ACETAMINOPHEN 650 MG RE SUPP: 650.0000 mg | RECTAL | Status: DC | PRN

## 2019-09-14 MED ORDER — DOCUSATE SODIUM 100 MG PO CAPS: 100.0000 mg | ORAL_CAPSULE | Freq: Two times a day (BID) | ORAL | 0 refills | Status: AC

## 2019-09-14 MED ORDER — FENTANYL CITRATE (PF) 100 MCG/2ML IJ SOLN
INTRAMUSCULAR | Status: DC | PRN
  Administered 2019-09-14: 100 ug via INTRAVENOUS

## 2019-09-14 MED ORDER — ONDANSETRON HCL 4 MG/2ML IJ SOLN
INTRAMUSCULAR | Status: AC
  Filled 2019-09-14: qty 2

## 2019-09-14 MED ORDER — ACETAMINOPHEN 500 MG PO TABS
ORAL_TABLET | ORAL | Status: AC
  Filled 2019-09-14: qty 2

## 2019-09-14 MED ORDER — PROPOFOL 10 MG/ML IV BOLUS
INTRAVENOUS | Status: AC
  Filled 2019-09-14: qty 20

## 2019-09-14 MED ORDER — ROCURONIUM BROMIDE 10 MG/ML (PF) SYRINGE
PREFILLED_SYRINGE | INTRAVENOUS | Status: AC
  Filled 2019-09-14: qty 10

## 2019-09-14 MED ORDER — DEXAMETHASONE SODIUM PHOSPHATE 10 MG/ML IJ SOLN
INTRAMUSCULAR | Status: AC
  Filled 2019-09-14: qty 1

## 2019-09-14 MED ORDER — GABAPENTIN 300 MG PO CAPS
ORAL_CAPSULE | ORAL | Status: AC
  Filled 2019-09-14: qty 1

## 2019-09-14 SURGICAL SUPPLY — 51 items
ADH SKN CLS APL DERMABOND .7 (GAUZE/BANDAGES/DRESSINGS)
APL PRP STRL LF DISP 70% ISPRP (MISCELLANEOUS) ×1
APL SKNCLS STERI-STRIP NONHPOA (GAUZE/BANDAGES/DRESSINGS) ×1
BALL CTTN LRG ABS STRL LF (GAUZE/BANDAGES/DRESSINGS) ×1
BENZOIN TINCTURE PRP APPL 2/3 (GAUZE/BANDAGES/DRESSINGS) ×1 IMPLANT
BLADE CLIPPER SURG (BLADE) IMPLANT
BLADE SURG 11 STRL SS (BLADE) ×2 IMPLANT
BLADE SURG 15 STRL LF DISP TIS (BLADE) ×1 IMPLANT
BLADE SURG 15 STRL SS (BLADE) ×2
CHLORAPREP W/TINT 26 (MISCELLANEOUS) ×2 IMPLANT
COTTONBALL LRG STERILE PKG (GAUZE/BANDAGES/DRESSINGS) ×1 IMPLANT
COVER BACK TABLE REUSABLE LG (DRAPES) ×2 IMPLANT
COVER MAYO STAND REUSABLE (DRAPES) ×2 IMPLANT
COVER WAND RF STERILE (DRAPES) IMPLANT
DECANTER SPIKE VIAL GLASS SM (MISCELLANEOUS) ×1 IMPLANT
DERMABOND ADVANCED (GAUZE/BANDAGES/DRESSINGS)
DERMABOND ADVANCED .7 DNX12 (GAUZE/BANDAGES/DRESSINGS) ×1 IMPLANT
DRAPE LAPAROTOMY 100X72 PEDS (DRAPES) ×2 IMPLANT
DRAPE UTILITY XL STRL (DRAPES) ×2 IMPLANT
DRSG TEGADERM 2-3/8X2-3/4 SM (GAUZE/BANDAGES/DRESSINGS) ×1 IMPLANT
DRSG TEGADERM 4X4.75 (GAUZE/BANDAGES/DRESSINGS) ×1 IMPLANT
ELECT COATED BLADE 2.86 ST (ELECTRODE) ×2 IMPLANT
ELECT REM PT RETURN 9FT ADLT (ELECTROSURGICAL) ×2
ELECTRODE REM PT RTRN 9FT ADLT (ELECTROSURGICAL) ×1 IMPLANT
GLOVE BIO SURGEON STRL SZ 6 (GLOVE) ×2 IMPLANT
GLOVE BIOGEL PI IND STRL 6.5 (GLOVE) ×1 IMPLANT
GLOVE BIOGEL PI IND STRL 7.0 (GLOVE) IMPLANT
GLOVE BIOGEL PI INDICATOR 6.5 (GLOVE) ×1
GLOVE BIOGEL PI INDICATOR 7.0 (GLOVE) ×2
GLOVE ECLIPSE 6.5 STRL STRAW (GLOVE) ×1 IMPLANT
GOWN STRL REUS W/ TWL LRG LVL3 (GOWN DISPOSABLE) ×2 IMPLANT
GOWN STRL REUS W/TWL LRG LVL3 (GOWN DISPOSABLE) ×4
NDL HYPO 25X1 1.5 SAFETY (NEEDLE) ×1 IMPLANT
NEEDLE HYPO 25X1 1.5 SAFETY (NEEDLE) ×2 IMPLANT
NS IRRIG 1000ML POUR BTL (IV SOLUTION) IMPLANT
PACK BASIN DAY SURGERY FS (CUSTOM PROCEDURE TRAY) ×2 IMPLANT
PENCIL SMOKE EVACUATOR (MISCELLANEOUS) ×2 IMPLANT
SLEEVE SCD COMPRESS KNEE MED (MISCELLANEOUS) ×2 IMPLANT
SPONGE GAUZE 2X2 8PLY STRL LF (GAUZE/BANDAGES/DRESSINGS) ×2 IMPLANT
SPONGE LAP 4X18 RFD (DISPOSABLE) ×2 IMPLANT
STRIP CLOSURE SKIN 1/2X4 (GAUZE/BANDAGES/DRESSINGS) IMPLANT
SUT ETHIBOND 0 MO6 C/R (SUTURE) ×1 IMPLANT
SUT MNCRL AB 4-0 PS2 18 (SUTURE) ×2 IMPLANT
SUT VIC AB 0 SH 27 (SUTURE) ×1 IMPLANT
SUT VIC AB 2-0 SH 27 (SUTURE)
SUT VIC AB 2-0 SH 27XBRD (SUTURE) IMPLANT
SUT VIC AB 3-0 SH 27 (SUTURE) ×2
SUT VIC AB 3-0 SH 27X BRD (SUTURE) ×1 IMPLANT
SUT VICRYL AB 3 0 TIES (SUTURE) IMPLANT
SYR CONTROL 10ML LL (SYRINGE) ×2 IMPLANT
TOWEL GREEN STERILE FF (TOWEL DISPOSABLE) ×2 IMPLANT

## 2019-09-14 NOTE — Discharge Instructions (Signed)
HERNIA REPAIR: POST OP INSTRUCTIONS  ######################################################################  EAT Gradually transition to a high fiber diet with a fiber supplement over the next few weeks after discharge.  Start with a pureed / full liquid diet (see below)  WALK Walk an hour a day.  Control your pain to do that.    CONTROL PAIN Control pain so that you can walk, sleep, tolerate sneezing/coughing, and go up/down stairs.  HAVE A BOWEL MOVEMENT DAILY Keep your bowels regular to avoid problems.  OK to try a laxative to override constipation.  OK to use an antidairrheal to slow down diarrhea.  Call if not better after 2 tries  CALL IF YOU HAVE PROBLEMS/CONCERNS Call if you are still struggling despite following these instructions. Call if you have concerns not answered by these instructions  ######################################################################    1. DIET: Follow a light bland diet & liquids the first 24 hours after arrival home, such as soup, liquids, starches, etc.  Be sure to drink plenty of fluids.  Quickly advance to a usual solid diet within a few days.  Avoid fast food or heavy meals as your are more likely to get nauseated or have irregular bowels.  A low-sugar, high-fiber diet for the rest of your life is ideal.   2. Take your usually prescribed home medications unless otherwise directed.  3. PAIN CONTROL: a. Pain is best controlled by a usual combination of three different methods TOGETHER: i. Ice/Heat ii. Over the counter pain medication iii. Prescription pain medication b. Most patients will experience some swelling and bruising around the hernia(s) such as the bellybutton, groins, or old incisions.  Ice packs or heating pads (30-60 minutes up to 6 times a day) will help. Use ice for the first few days to help decrease swelling and bruising, then switch to heat to help relax tight/sore spots and speed recovery.  Some people prefer to  use ice alone, heat alone, alternating between ice & heat.  Experiment to what works for you.  Swelling and bruising can take several weeks to resolve.   c. It is helpful to take an over-the-counter pain medication regularly for the first few weeks.  Choose one of the following that works best for you: i. Naproxen (Aleve, etc)  Two 220mg  tabs twice a day OR Ibuprofen (Advil, etc) Three 200mg  tabs four times a day (every meal & bedtime) AND ii. Acetaminophen (Tylenol, etc) 325-650mg  four times a day (every meal & bedtime) d. A  prescription for pain medication should be given to you upon discharge.  Take your pain medication as prescribed.  i. If you are having problems/concerns with the prescription medicine (does not control pain, nausea, vomiting, rash, itching, etc), please call us [phone] to see if we need to switch you to a different pain medicine that will work better for you and/or control your side effect better. ii. If you need a refill on your pain medication, please contact your pharmacy.  They will contact our office to request authorization. Prescriptions will not be filled after 5 pm or on week-ends.  4. Avoid getting constipated.  Between the surgery and the pain medications, it is common to experience some constipation.  Increasing fluid intake and taking a fiber supplement (such as Metamucil, Citrucel, FiberCon, MiraLax, etc) 1-2 times a day regularly will usually help prevent this problem from occurring.  A mild laxative (prune juice, Milk of Magnesia, MiraLax, etc) should be taken according to package directions if there are no bowel movements after  48 hours.    5. Wash / shower every day, starting 2 days after surgery.  You may shower over the Steri-Strips as they are waterproof.  No soaking/swimming/submerging until the incision is completely healed.  No running, scrubbing, lotions or ointments to incision.  6. Remove your outer bandage to days after surgery.  Steri-Strips  will peel off after 1 to 2 weeks. You may leave the incisions open to air.  You may replace a dressing/Band-Aid to cover an incision for comfort if you wish.  Continue to shower over incision(s) after the dressing is off.  7. ACTIVITIES as tolerated:   a. You may resume regular (light) daily activities beginning the next day--such as daily self-care, walking, climbing stairs--gradually increasing activities as tolerated.  Control your pain so that you can walk an hour a day.  If you can walk 30 minutes without difficulty, it is safe to try more intense activity such as jogging, treadmill, bicycling, low-impact aerobics, swimming, etc. b. Refrain from the most intensive and strenuous activity such as sit-ups, heavy lifting, contact sports, etc  Refrain from any heavy lifting (20 pounds or more) or straining until 6 weeks after surgery.   c. DO NOT PUSH THROUGH PAIN.  Let pain be your guide: If it hurts to do something, don't do it.  Pain is your body warning you to avoid that activity for another week until the pain goes down. d. You may drive when you are no longer taking prescription pain medication, you can comfortably wear a seatbelt, and you can safely maneuver your car and apply brakes. e. Bonita Quin may have sexual intercourse when it is comfortable.   8. FOLLOW UP in our office a. Please call [facility] at [phone] to set up an appointment to see your surgeon in the office for a follow-up appointment approximately 2-3 weeks after your surgery. b. Make sure that you call for this appointment the day you arrive home to insure a convenient appointment time.  9.          If you have disability of FMLA / Family leave forms, please bring the forms to the office for processing.  (do not give to your surgeon).  WHEN TO CALL us [phone]: 1. Poor pain control 2. Reactions / problems with new medications (rash/itching, nausea, etc)  3. Fever over 101.5 F (38.5 C) 4. Inability to urinate 5. Nausea  and/or vomiting 6. Worsening swelling or bruising 7. Continued bleeding from incision. 8. Increased pain, redness, or drainage from the incision              The clinic staff is available to answer your questions during regular business hours (8:30am-5pm).  Please dont hesitate to call and ask to speak to one of our nurses for clinical concerns.              If you have a medical emergency, go to the nearest emergency room or call 911.             A surgeon from [facility] is always on call at the hospitals in [facility], [place] [address], [address], [place], [place]  [address] ?  [address] [address], [place], [place]   [address] MAIN: [phone] ? TOLL FREE: [phone] ? FAX: [phone] [email or web address]    Post Anesthesia Home Care Instructions  Activity: Get plenty of rest for the remainder of the day. A responsible individual must stay with you for 24  hours following the procedure.  For the next 24 hours, DO NOT: -Drive a car -Advertising copywriterperate machinery -Drink alcoholic beverages -Take any medication unless instructed by your physician -Make any legal decisions or sign important papers.  Meals: Start with liquid foods such as gelatin or soup. Progress to regular foods as tolerated. Avoid greasy, spicy, heavy foods. If nausea and/or vomiting occur, drink only clear liquids until the nausea and/or vomiting subsides. Call your physician if vomiting continues.  Special Instructions/Symptoms: Your throat may feel dry or sore from the anesthesia or the breathing tube placed in your throat during surgery. If this causes discomfort, gargle with warm salt water. The discomfort should disappear within 24 hours.  If you had a scopolamine patch placed behind your ear for the management of post- operative nausea and/or vomiting:  1. The medication in the patch is effective for 72 hours, after which it should be removed.  Wrap patch in a  tissue and discard in the trash. Wash hands thoroughly with soap and water. 2. You may remove the patch earlier than 72 hours if you experience unpleasant side effects which may include dry mouth, dizziness or visual disturbances. 3. Avoid touching the patch. Wash your hands with soap and water after contact with the patch.

## 2019-09-14 NOTE — Anesthesia Preprocedure Evaluation (Signed)
Anesthesia Evaluation  Patient identified by MRN, date of birth, ID band Patient awake    Reviewed: Allergy & Precautions, H&P , NPO status , Patient's Chart, lab work & pertinent test results  Airway Mallampati: II  TM Distance: >3 FB Neck ROM: full    Dental no notable dental hx. (+) Teeth Intact   Pulmonary neg pulmonary ROS,    Pulmonary exam normal breath sounds clear to auscultation       Cardiovascular negative cardio ROS Normal cardiovascular exam Rhythm:regular Rate:Normal     Neuro/Psych negative psych ROS   GI/Hepatic negative GI ROS, Neg liver ROS,   Endo/Other  Morbid obesity  Renal/GU negative Renal ROS     Musculoskeletal   Abdominal (+) + obese,   Peds  Hematology   Anesthesia Other Findings   Reproductive/Obstetrics                             Anesthesia Physical  Anesthesia Plan  ASA: III  Anesthesia Plan: General   Post-op Pain Management:    Induction: Intravenous  PONV Risk Score and Plan: 3 and Ondansetron, Dexamethasone, Midazolam and Treatment may vary due to age or medical condition  Airway Management Planned: Oral ETT  Additional Equipment:   Intra-op Plan:   Post-operative Plan: Extubation in OR  Informed Consent: I have reviewed the patients History and Physical, chart, labs and discussed the procedure including the risks, benefits and alternatives for the proposed anesthesia with the patient or authorized representative who has indicated his/her understanding and acceptance.       Plan Discussed with:   Anesthesia Plan Comments:         Anesthesia Quick Evaluation

## 2019-09-14 NOTE — Transfer of Care (Signed)
Immediate Anesthesia Transfer of Care Note  Patient: Claire Schmidt  Procedure(s) Performed: OPEN REPAIR INCARCERATED SUPRAUMBILICAL HERNIA WITH MESH (N/A Abdomen)  Patient Location: PACU  Anesthesia Type:General  Level of Consciousness: awake and patient cooperative  Airway & Oxygen Therapy: Patient Spontanous Breathing and Patient connected to face mask oxygen  Post-op Assessment: Report given to RN and Post -op Vital signs reviewed and stable  Post vital signs: Reviewed and stable  Last Vitals:  Vitals Value Taken Time  BP 132/67 09/14/19 1337  Temp    Pulse 95 09/14/19 1338  Resp 26 09/14/19 1338  SpO2 100 % 09/14/19 1338  Vitals shown include unvalidated device data.  Last Pain:  Vitals:   09/14/19 1209  TempSrc: Oral  PainSc: 4       Patients Stated Pain Goal: 5 (62/03/55 9741)  Complications: No apparent anesthesia complications

## 2019-09-14 NOTE — Anesthesia Procedure Notes (Signed)
Procedure Name: Intubation Date/Time: 09/14/2019 12:27 PM Performed by: Marrianne Mood, CRNA Pre-anesthesia Checklist: Patient identified, Emergency Drugs available, Suction available, Patient being monitored and Timeout performed Patient Re-evaluated:Patient Re-evaluated prior to induction Oxygen Delivery Method: Circle system utilized Preoxygenation: Pre-oxygenation with 100% oxygen Induction Type: IV induction Ventilation: Mask ventilation without difficulty Laryngoscope Size: Mac and 3 Grade View: Grade II Tube type: Oral Tube size: 7.0 mm Number of attempts: 1 Airway Equipment and Method: Stylet and Oral airway Placement Confirmation: ETT inserted through vocal cords under direct vision,  positive ETCO2 and breath sounds checked- equal and bilateral Secured at: 20 cm Tube secured with: Tape Dental Injury: Teeth and Oropharynx as per pre-operative assessment

## 2019-09-14 NOTE — Op Note (Signed)
Operative Note  Claire Schmidt  [id number]  [id number]  09/14/2019   Surgeon: Vikki Ports A ConnorMD  Procedure performed: epigastric hernia repair  Preop diagnosis: incarcerated supraumbilical hernia  Post-op diagnosis/intraop findings: incarcerated epigastric hernia containing preperitoneal fat, diastasis recti  Specimens: none Retained items: none EBL: minimal cc Complications: none  Description of procedure: After obtaining informed consent the patient was taken to the operating room and placed supine on operating room table wheregeneral endotracheal anesthesia was initiated, preoperative antibiotics were administered, SCDs applied, and a formal timeout was performed.  The patient's abdomen was prepped and draped in usual sterile fashion.  After infiltration with local a vertical incision was made over the palpable incarcerated fat just above the umbilicus and soft tissues were dissected with cautery until the incarcerated fat was encountered. This was isolated and skeletonized down to the fascial edges. This appeared to be preperitoneal fat and the larger component was excised with cautery and discarded; the remainder was reduced. The fascial defect is just superior to the umbilicus and by palpation is within a diastasis recti/ attenuated fascia. The defect measured 1cm transverse by 29mm vertical and this was closed transversely with interrupted 0 ethibonds. Hemostasis was ensured within the wound with cautery. The deeper tissue was reapproximated with 3-0 vicryls and the incision was closed with running subcuticular monocryl. Benzoin, steri strips and a light pressure dressing were then applied. The patient was then awakened, extubated and taken to PACU in stable condition.   All counts were correct at the completion of the case.

## 2019-09-14 NOTE — Anesthesia Postprocedure Evaluation (Signed)
Anesthesia Post Note  Patient: Claire Schmidt  Procedure(s) Performed: OPEN REPAIR INCARCERATED SUPRAUMBILICAL HERNIA (N/A Abdomen)     Patient location during evaluation: PACU Anesthesia Type: General Level of consciousness: awake and alert Pain management: pain level controlled Vital Signs Assessment: post-procedure vital signs reviewed and stable Respiratory status: spontaneous breathing, nonlabored ventilation and respiratory function stable Cardiovascular status: blood pressure returned to baseline and stable Postop Assessment: no apparent nausea or vomiting Anesthetic complications: no    Last Vitals:  Vitals:   09/14/19 1415 09/14/19 1435  BP: 110/65 (!) 104/58  Pulse: 89 88  Resp: (!) 23 16  Temp:  36.6 C  SpO2: 97% 98%    Last Pain:  Vitals:   09/14/19 1435  TempSrc:   PainSc: 0-No pain                 Lynda Rainwater

## 2019-09-14 NOTE — Interval H&P Note (Signed)
History and Physical Interval Note:  09/14/2019 11:46 AM  Claire Schmidt  has presented today for surgery, with the diagnosis of INCARCERATED HERNIA.  The various methods of treatment have been discussed with the patient and family. After consideration of risks, benefits and other options for treatment, the patient has consented to  Procedure(s): OPEN REPAIR INCARCERATED SUPRAUMBILICAL HERNIA WITH MESH (N/A) as a surgical intervention.  The patient's history has been reviewed, patient examined, no change in status, stable for surgery.  I have reviewed the patient's chart and labs.  Questions were answered to the patient's satisfaction.     Jasreet Dickie Rich Brave

## 2019-09-15 ENCOUNTER — Encounter (HOSPITAL_BASED_OUTPATIENT_CLINIC_OR_DEPARTMENT_OTHER): Payer: Self-pay | Admitting: Surgery

## 2019-09-24 ENCOUNTER — Telehealth: Payer: BC Managed Care – PPO | Admitting: Emergency Medicine

## 2019-09-24 DIAGNOSIS — B009 Herpesviral infection, unspecified: Secondary | ICD-10-CM

## 2019-09-24 DIAGNOSIS — R0981 Nasal congestion: Secondary | ICD-10-CM | POA: Diagnosis not present

## 2019-09-24 NOTE — Progress Notes (Signed)
Time spent: 10 min  E visit for Allergic Rhinitis We are sorry that you are not feeling well.  Here is how we plan to help!  Based on what you have shared with me it looks like you have Allergic Rhinitis.  Rhinitis is when a reaction occurs that causes nasal congestion, runny nose, sneezing, and itching.  Most types of rhinitis are caused by an inflammation and are associated with symptoms in the eyes ears or throat. Occasional itchy rash or lesions on the face can occur as well.    There are several types of rhinitis.  The most common are acute rhinitis, which is usually caused by a viral illness, allergic or seasonal rhinitis, and nonallergic or year-round rhinitis.  Nasal allergies occur certain times of the year.  Allergic rhinitis is caused when allergens in the air trigger the release of histamine in the body.  Histamine causes itching, swelling, and fluid to build up in the fragile linings of the nasal passages, sinuses and eyelids.  An itchy nose and clear discharge are common.    I recommend the following over the counter treatments: Allegra 60 mg twice daily or any other over the counter allergy (anti-histamine) medicine like zyrtec, Claritin, etc.   I also would recommend a nasal spray: Flonase 2 sprays into each nostril once daily  You may also benefit from eye drops such as: Visine 1-2 drops each eye twice daily as needed if you have itchy or watery eyes  You can apply over the counter bandryl cream or hydrocortizone cream over itchy or bump rash as needed.  HOME CARE:   You can use an over-the-counter saline nasal spray as needed  Avoid areas where there is heavy dust, mites, or molds  Stay indoors on windy days during the pollen season  Keep windows closed in home, at least in bedroom; use air conditioner.  Use high-efficiency house air filter  Keep windows closed in car, turn AC on re-circulate  Avoid playing out with dog during pollen season  GET HELP RIGHT  AWAY IF:   If your symptoms do not improve within 10 days  You become short of breath  You develop yellow or green discharge from your nose for over 3 days  You have coughing fits  You develop severe facial swelling or worsening rash, throat swelling or tightness, chest pain, shortness of breath or any other signs of severe allergies  MAKE SURE YOU:   Understand these instructions  Will watch your condition  Will get help right away if you are not doing well or get worse  Thank you for choosing an e-visit. Your e-visit answers were reviewed by a board certified advanced clinical practitioner to complete your personal care plan. Depending upon the condition, your plan could have included both over the counter or prescription medications. Please review your pharmacy choice. Be sure that the pharmacy you have chosen is open so that you can pick up your prescription now.  If there is a problem you may message your provider in MyChart to have the prescription routed to another pharmacy. Your safety is important to [place]. If you have drug allergies check your prescription carefully.  For the next 24 hours, you can use MyChart to ask questions about today's visit, request a non-urgent call back, or ask for a work or school excuse from your e-visit provider. You will get an email in the next two days asking about your experience. I hope that your e-visit has been valuable  and will speed your recovery.  I hope you feel better  Carmon Sails PA-C

## 2019-10-02 ENCOUNTER — Encounter (HOSPITAL_COMMUNITY): Payer: Self-pay

## 2019-10-02 ENCOUNTER — Other Ambulatory Visit: Payer: Self-pay

## 2019-10-02 ENCOUNTER — Emergency Department (HOSPITAL_COMMUNITY)
Admission: EM | Admit: 2019-10-02 | Discharge: 2019-10-02 | Disposition: A | Discharge status: Home / Self Care | Payer: BC Managed Care – PPO | Attending: Emergency Medicine | Admitting: Emergency Medicine

## 2019-10-02 DIAGNOSIS — J029 Acute pharyngitis, unspecified: Secondary | ICD-10-CM | POA: Diagnosis not present

## 2019-10-02 DIAGNOSIS — Z20828 Contact with and (suspected) exposure to other viral communicable diseases: Secondary | ICD-10-CM | POA: Diagnosis not present

## 2019-10-02 DIAGNOSIS — B9789 Other viral agents as the cause of diseases classified elsewhere: Secondary | ICD-10-CM | POA: Diagnosis not present

## 2019-10-02 DIAGNOSIS — R05 Cough: Secondary | ICD-10-CM | POA: Insufficient documentation

## 2019-10-02 DIAGNOSIS — J028 Acute pharyngitis due to other specified organisms: Secondary | ICD-10-CM | POA: Diagnosis not present

## 2019-10-02 DIAGNOSIS — R070 Pain in throat: Secondary | ICD-10-CM | POA: Diagnosis not present

## 2019-10-02 MED ORDER — IBUPROFEN 800 MG PO TABS
800.0000 mg | ORAL_TABLET | Freq: Once | ORAL | Status: AC
  Administered 2019-10-02: 800 mg via ORAL
  Filled 2019-10-02: qty 1

## 2019-10-02 MED ORDER — IBUPROFEN 800 MG PO TABS: 800.0000 mg | ORAL_TABLET | Freq: Three times a day (TID) | ORAL | 0 refills | Status: DC | PRN

## 2019-10-02 NOTE — ED Notes (Signed)
Pt verbalizes understanding of DC instructions. Pt belongings returned and is ambulatory out of ED.  

## 2019-10-02 NOTE — Discharge Instructions (Addendum)
Take tylenol, motrin for sore throat and fever   Stay hydrated   Take motrin for sore throat   See your doctor  Return to ER if you have worse sore throat, fever, trouble swallowing, trouble breathing

## 2019-10-02 NOTE — ED Provider Notes (Signed)
[facility] DEPT Provider Note   CSN: [id number] Arrival date & time: 10/02/19  1715     History Chief Complaint  Patient presents with  . Sore Throat    Claire Schmidt is a 23 y.o. female here with sore throat. Patient has been sore throat for the last several days. Has some mucous as well. Nonproductive cough as well. No fevers. She states that her child is recently diagnosed with adenovirus and she thinks that she is likely having the same thing.   The history is provided by the patient.       Past Medical History:  Diagnosis Date  . Nexplanon in place - jan 2019 12/11/2018  . Supraumbilical hernia     Patient Active Problem List   Diagnosis Date Noted  . Nexplanon in place - jan 2019 12/11/2018  . Ventral hernia without obstruction or gangrene 12/11/2018  . Constipation 12/11/2018  . H/O allergic urticaria 12/02/2014  . Atopic dermatitis 05/18/2014  . Allergic rhinitis 03/22/2014    Past Surgical History:  Procedure Laterality Date  . CESAREAN SECTION N/A 08/26/2017   Procedure: CESAREAN SECTION;  Surgeon: Servando Salina, MD;  Location: [facility];  Service: Obstetrics;  Laterality: N/A;  . NO PAST SURGERIES    . UMBILICAL HERNIA REPAIR N/A 09/14/2019   Procedure: OPEN REPAIR INCARCERATED SUPRAUMBILICAL HERNIA;  Surgeon: Clovis Riley, MD;  Location: [facility];  Service: General;  Laterality: N/A;     OB History    Gravida  1   Para      Term      Preterm      AB      Living        SAB      TAB      Ectopic      Multiple      Live Births              Family History  Problem Relation Age of Onset  . Dementia Maternal Grandmother   . Heart attack Paternal Grandmother   . Asthma Neg Hx   . Diabetes Neg Hx   . Heart disease Neg Hx   . Hyperlipidemia Neg Hx   . Hypertension Neg Hx     Social History   Tobacco Use  . Smoking status: Never Smoker  . Smokeless tobacco:  Never Used  . Tobacco comment: Pt states no current smokers in household  Substance Use Topics  . Alcohol use: No    Alcohol/week: 0.0 standard drinks  . Drug use: No    Home Medications Prior to Admission medications   Medication Sig Start Date End Date Taking? Authorizing Provider  docusate sodium (COLACE) 100 MG capsule Take 1 capsule (100 mg total) by mouth 2 (two) times daily. 09/14/19 10/14/19  Clovis Riley, MD  etonogestrel (NEXPLANON) 68 MG IMPL implant 1 each by Subdermal route once.    [provider]  oxyCODONE (ROXICODONE) 5 MG immediate release tablet Take 1 tablet (5 mg total) by mouth every 8 (eight) hours as needed. Alternate tylenol and ibuprofen for the first few days. Take narcotic pain medication only if needed for severe/ breakthrough pain. 09/14/19 09/13/20  Clovis Riley, MD    Allergies    Bee venom  Review of Systems   Review of Systems  HENT: Positive for sore throat.   All other systems reviewed and are negative.   Physical Exam Updated Vital Signs BP 127/86 (BP  Location: Right Arm)   Pulse 98   Temp 98.7 F (37.1 C) (Oral)   Resp 17   SpO2 99%   Physical Exam Vitals and nursing note reviewed.  Constitutional:      Appearance: She is well-developed.  HENT:     Head: Normocephalic.     Right Ear: Tympanic membrane normal.     Left Ear: Tympanic membrane normal.     Mouth/Throat:     Comments: OP not red or swollen, no tonsillar exudates  Eyes:     Conjunctiva/sclera: Conjunctivae normal.  Cardiovascular:     Rate and Rhythm: Normal rate and regular rhythm.     Heart sounds: Normal heart sounds.  Pulmonary:     Effort: Pulmonary effort is normal.     Breath sounds: Normal breath sounds.  Abdominal:     General: Bowel sounds are normal.     Palpations: Abdomen is soft.  Musculoskeletal:     Cervical back: Normal range of motion.  Skin:    General: Skin is warm.     Capillary Refill: Capillary refill takes less than 2  seconds.  Neurological:     General: No focal deficit present.     Mental Status: She is alert and oriented to person, place, and time.  Psychiatric:        Mood and Affect: Mood normal.        Behavior: Behavior normal.     ED Results / Procedures / Treatments   Labs (all labs ordered are listed, but only abnormal results are displayed) Labs Reviewed - No data to display  EKG None  Radiology No results found.  Procedures Procedures (including critical care time)  Medications Ordered in ED Medications  ibuprofen (ADVIL) tablet 800 mg (has no administration in time range)    ED Course  I have reviewed the triage vital signs and the nursing notes.  Pertinent labs & imaging results that were available during my care of the patient were reviewed by me and considered in my medical decision making (see chart for details).    MDM Rules/Calculators/A&P                      Claire Schmidt is a 23 y.o. female here with sore throat. OP is clear and there is no tonsillar exudates. She is afebrile and well appearing and no cervical LAD. Lungs clear. Her child recently had adenovirus and I think likely viral pharyngitis and she has low Centor score and I have low suspicion for strep. Offered COVID test and respiratory panel but she refused   Final Clinical Impression(s) / ED Diagnoses Final diagnoses:  None    Rx / DC Orders ED Discharge Orders    None       Charlynne Pander, MD 10/02/19 [phone]

## 2019-10-02 NOTE — ED Triage Notes (Signed)
Pt states sore throat since last night. Pt states it is painful to swallow.

## 2019-10-21 DIAGNOSIS — Z1329 Encounter for screening for other suspected endocrine disorder: Secondary | ICD-10-CM | POA: Diagnosis not present

## 2019-10-21 DIAGNOSIS — Z01419 Encounter for gynecological examination (general) (routine) without abnormal findings: Secondary | ICD-10-CM | POA: Diagnosis not present

## 2019-10-21 DIAGNOSIS — Z6841 Body Mass Index (BMI) 40.0 and over, adult: Secondary | ICD-10-CM | POA: Diagnosis not present

## 2019-10-21 DIAGNOSIS — Z0000 Encounter for general adult medical examination without abnormal findings: Secondary | ICD-10-CM | POA: Diagnosis not present

## 2019-10-21 DIAGNOSIS — Z13220 Encounter for screening for lipoid disorders: Secondary | ICD-10-CM | POA: Diagnosis not present

## 2019-10-21 DIAGNOSIS — N921 Excessive and frequent menstruation with irregular cycle: Secondary | ICD-10-CM | POA: Diagnosis not present

## 2019-10-21 DIAGNOSIS — Z118 Encounter for screening for other infectious and parasitic diseases: Secondary | ICD-10-CM | POA: Diagnosis not present

## 2019-10-21 DIAGNOSIS — Z131 Encounter for screening for diabetes mellitus: Secondary | ICD-10-CM | POA: Diagnosis not present

## 2019-10-21 DIAGNOSIS — Z01411 Encounter for gynecological examination (general) (routine) with abnormal findings: Secondary | ICD-10-CM | POA: Diagnosis not present

## 2019-10-21 LAB — HEPATIC FUNCTION PANEL
ALT: 17 (ref 7–35)
AST: 13 (ref 13–35)
Alkaline Phosphatase: 78 (ref 25–125)
Bilirubin, Total: 0.3

## 2019-10-21 LAB — BASIC METABOLIC PANEL
BUN: 9 (ref 4–21)
CO2: 23 — AB (ref 13–22)
Chloride: 105 (ref 99–108)
Creatinine: 0.9 (ref 0.5–1.1)
Glucose: 90
Potassium: 4.2 (ref 3.4–5.3)
Sodium: 140 (ref 137–147)

## 2019-10-21 LAB — HEMOGLOBIN A1C: Hemoglobin A1C: 5.6

## 2019-10-21 LAB — COMPREHENSIVE METABOLIC PANEL
Albumin: 4 (ref 3.5–5.0)
Calcium: 9.5 (ref 8.7–10.7)
GFR calc Af Amer: 106
GFR calc non Af Amer: 92
Globulin: 2.6

## 2019-10-21 LAB — LIPID PANEL
Cholesterol: 180 (ref 0–200)
HDL: 40 (ref 35–70)

## 2019-11-10 DIAGNOSIS — Z6841 Body Mass Index (BMI) 40.0 and over, adult: Secondary | ICD-10-CM | POA: Diagnosis not present

## 2019-11-19 DIAGNOSIS — Z03818 Encounter for observation for suspected exposure to other biological agents ruled out: Secondary | ICD-10-CM | POA: Diagnosis not present

## 2019-11-19 DIAGNOSIS — Z20828 Contact with and (suspected) exposure to other viral communicable diseases: Secondary | ICD-10-CM | POA: Diagnosis not present

## 2019-11-22 DIAGNOSIS — Z03818 Encounter for observation for suspected exposure to other biological agents ruled out: Secondary | ICD-10-CM | POA: Diagnosis not present

## 2019-11-22 DIAGNOSIS — Z20828 Contact with and (suspected) exposure to other viral communicable diseases: Secondary | ICD-10-CM | POA: Diagnosis not present

## 2019-12-21 ENCOUNTER — Encounter (HOSPITAL_COMMUNITY): Payer: Self-pay

## 2019-12-21 ENCOUNTER — Ambulatory Visit (HOSPITAL_COMMUNITY)
Admission: EM | Admit: 2019-12-21 | Discharge: 2019-12-21 | Disposition: A | Discharge status: Home / Self Care | Payer: BC Managed Care – PPO

## 2019-12-21 ENCOUNTER — Other Ambulatory Visit: Payer: Self-pay

## 2019-12-21 DIAGNOSIS — G43811 Other migraine, intractable, with status migrainosus: Secondary | ICD-10-CM | POA: Diagnosis not present

## 2019-12-21 MED ORDER — KETOROLAC TROMETHAMINE 30 MG/ML IJ SOLN
INTRAMUSCULAR | Status: AC
  Filled 2019-12-21: qty 1

## 2019-12-21 MED ORDER — ONDANSETRON 4 MG PO TBDP
ORAL_TABLET | ORAL | Status: AC
  Filled 2019-12-21: qty 1

## 2019-12-21 MED ORDER — ONDANSETRON HCL 4 MG PO TABS: 4.0000 mg | ORAL_TABLET | Freq: Four times a day (QID) | ORAL | 0 refills | Status: DC

## 2019-12-21 MED ORDER — KETOROLAC TROMETHAMINE 30 MG/ML IJ SOLN
30.0000 mg | Freq: Once | INTRAMUSCULAR | Status: AC
  Administered 2019-12-21: 30 mg via INTRAMUSCULAR

## 2019-12-21 MED ORDER — ONDANSETRON 4 MG PO TBDP
4.0000 mg | ORAL_TABLET | Freq: Once | ORAL | Status: AC
  Administered 2019-12-21: 4 mg via ORAL

## 2019-12-21 NOTE — ED Triage Notes (Signed)
Patient complains of headache above left eye since yesterday. Sensitive to light and sound and has several emesis episodes.

## 2019-12-21 NOTE — ED Provider Notes (Signed)
[facility]    CSN: [id number] Arrival date & time: 12/21/19  1833      History   Chief Complaint Chief Complaint  Patient presents with  . Headache    HPI Claire Schmidt is a 24 y.o. female.   Patient reports headache since yesterday.  Has taken Excedrin with no relief.  Reports that she is sensitive to light and sound, is also nauseated and has vomited a few times today.  Reports history of migraines, but normally can be managed with Tylenol and Motrin at home.  Denies fever, body aches, chills, diarrhea, sore throat, shortness of breath, rash, other symptoms.  ROS per HPI  The history is provided by the patient.    Past Medical History:  Diagnosis Date  . Nexplanon in place - jan 2019 12/11/2018  . Supraumbilical hernia     Patient Active Problem List   Diagnosis Date Noted  . Nexplanon in place - jan 2019 12/11/2018  . Ventral hernia without obstruction or gangrene 12/11/2018  . Constipation 12/11/2018  . H/O allergic urticaria 12/02/2014  . Atopic dermatitis 05/18/2014  . Allergic rhinitis 03/22/2014    Past Surgical History:  Procedure Laterality Date  . CESAREAN SECTION N/A 08/26/2017   Procedure: CESAREAN SECTION;  Surgeon: Servando Salina, MD;  Location: [facility];  Service: Obstetrics;  Laterality: N/A;  . NO PAST SURGERIES    . UMBILICAL HERNIA REPAIR N/A 09/14/2019   Procedure: OPEN REPAIR INCARCERATED SUPRAUMBILICAL HERNIA;  Surgeon: Clovis Riley, MD;  Location: [facility];  Service: General;  Laterality: N/A;    OB History    Gravida  1   Para      Term      Preterm      AB      Living        SAB      TAB      Ectopic      Multiple      Live Births               Home Medications    Prior to Admission medications   Medication Sig Start Date End Date Taking? Authorizing Provider  etonogestrel (NEXPLANON) 68 MG IMPL implant 1 each by Subdermal route once.   Yes [provider]  phentermine (ADIPEX-P) 37.5 MG tablet SMARTSIG:.5 Tablet(s) By Mouth Every Morning 11/20/19  Yes [provider]  ibuprofen (ADVIL) 800 MG tablet Take 1 tablet (800 mg total) by mouth every 8 (eight) hours as needed. 10/02/19   Drenda Freeze, MD  ondansetron (ZOFRAN) 4 MG tablet Take 1 tablet (4 mg total) by mouth every 6 (six) hours. 12/21/19   Faustino Congress, NP  oxyCODONE (ROXICODONE) 5 MG immediate release tablet Take 1 tablet (5 mg total) by mouth every 8 (eight) hours as needed. Alternate tylenol and ibuprofen for the first few days. Take narcotic pain medication only if needed for severe/ breakthrough pain. 09/14/19 09/13/20  Clovis Riley, MD    Family History Family History  Problem Relation Age of Onset  . Dementia Maternal Grandmother   . Heart attack Paternal Grandmother   . Asthma Neg Hx   . Diabetes Neg Hx   . Heart disease Neg Hx   . Hyperlipidemia Neg Hx   . Hypertension Neg Hx     Social History Social History   Tobacco Use  . Smoking status: Never Smoker  . Smokeless tobacco: Never Used  . Tobacco comment:  Pt states no current smokers in household  Substance Use Topics  . Alcohol use: No    Alcohol/week: 0.0 standard drinks  . Drug use: No     Allergies   Bee venom   Review of Systems Review of Systems   Physical Exam Triage Vital Signs ED Triage Vitals  Enc Vitals Group     BP 12/21/19 1934 135/77     Pulse Rate 12/21/19 1934 61     Resp 12/21/19 1934 18     Temp 12/21/19 1934 98.2 F (36.8 C)     Temp Source 12/21/19 1934 Oral     SpO2 12/21/19 1934 100 %     Weight 12/21/19 1931 215 lb (97.5 kg)     Height 12/21/19 1931 5' (1.524 m)     Head Circumference --      Peak Flow --      Pain Score 12/21/19 1931 9     Pain Loc --      Pain Edu? --      Excl. in GC? --    No data found.  Updated Vital Signs BP 135/77 (BP Location: Right Arm)   Pulse 61   Temp 98.2 F (36.8 C) (Oral)   Resp 18   Ht 5'  (1.524 m)   Wt 215 lb (97.5 kg)   SpO2 100%   BMI 41.99 kg/m   Visual Acuity Right Eye Distance:   Left Eye Distance:   Bilateral Distance:    Right Eye Near:   Left Eye Near:    Bilateral Near:     Physical Exam Vitals and nursing note reviewed.  Constitutional:      General: She is not in acute distress.    Appearance: Normal appearance. She is well-developed.  HENT:     Head: Normocephalic and atraumatic.      Comments: Area of pain per patient    Nose: Nose normal.     Mouth/Throat:     Mouth: Mucous membranes are moist.  Eyes:     Extraocular Movements: Extraocular movements intact.     Conjunctiva/sclera: Conjunctivae normal.  Cardiovascular:     Rate and Rhythm: Normal rate and regular rhythm.     Heart sounds: Normal heart sounds. No murmur.  Pulmonary:     Effort: Pulmonary effort is normal. No respiratory distress.     Breath sounds: Normal breath sounds.  Abdominal:     General: Bowel sounds are normal. There is no distension.     Palpations: Abdomen is soft. There is no mass.     Tenderness: There is no abdominal tenderness. There is no guarding or rebound.     Hernia: No hernia is present.  Musculoskeletal:        General: Normal range of motion.     Cervical back: Normal range of motion and neck supple.  Skin:    General: Skin is warm and dry.     Capillary Refill: Capillary refill takes less than 2 seconds.  Neurological:     General: No focal deficit present.     Mental Status: She is alert and oriented to person, place, and time.  Psychiatric:        Mood and Affect: Mood normal.        Behavior: Behavior normal.      UC Treatments / Results  Labs (all labs ordered are listed, but only abnormal results are displayed) Labs Reviewed - No data to display  EKG   Radiology No  results found.  Procedures Procedures (including critical care time)  Medications Ordered in UC Medications  ketorolac (TORADOL) 30 MG/ML injection 30 mg (30  mg Intramuscular Given 12/21/19 2010)  ondansetron (ZOFRAN-ODT) disintegrating tablet 4 mg (4 mg Oral Given 12/21/19 2010)    Initial Impression / Assessment and Plan / UC Course  I have reviewed the triage vital signs and the nursing notes.  Pertinent labs & imaging results that were available during my care of the patient were reviewed by me and considered in my medical decision making (see chart for details).     Migraine with nausea today.  Toradol 30 mg IM given in office today Zofran 4 mg sublingual given in office today as well.  To help with nausea Zofran 4 mg every 6 hours as needed for nausea, vomiting has been sent to the pharmacy.  Instructed to follow-up with primary care or this office as needed.  Instructed to go to the ER for the worst headache of her life, trouble swallowing, trouble breathing, other concerning symptoms. Final Clinical Impressions(s) / UC Diagnoses   Final diagnoses:  Other migraine with status migrainosus, intractable     Discharge Instructions     You are having a migraine. I have sent Zofran to your pharmacy.  You received zofran and an injection of toradol in the office.   If symptoms are not resolving you may follow up at primary care or this office as needed.   Go to the ER for changes in vision, worst headache of your life, other concerning symptoms.     ED Prescriptions    Medication Sig Dispense Auth. Provider   ondansetron (ZOFRAN) 4 MG tablet Take 1 tablet (4 mg total) by mouth every 6 (six) hours. 12 tablet Moshe Cipro, NP     PDMP not reviewed this encounter.   Moshe Cipro, NP 12/22/19 1257

## 2019-12-21 NOTE — Discharge Instructions (Signed)
You are having a migraine. I have sent Zofran to your pharmacy.  You received zofran and an injection of toradol in the office.   If symptoms are not resolving you may follow up at primary care or this office as needed.   Go to the ER for changes in vision, worst headache of your life, other concerning symptoms.

## 2019-12-29 DIAGNOSIS — Z6841 Body Mass Index (BMI) 40.0 and over, adult: Secondary | ICD-10-CM | POA: Diagnosis not present

## 2020-02-29 DIAGNOSIS — Z6838 Body mass index (BMI) 38.0-38.9, adult: Secondary | ICD-10-CM | POA: Diagnosis not present

## 2020-02-29 DIAGNOSIS — R5383 Other fatigue: Secondary | ICD-10-CM | POA: Diagnosis not present

## 2020-02-29 DIAGNOSIS — N921 Excessive and frequent menstruation with irregular cycle: Secondary | ICD-10-CM | POA: Diagnosis not present

## 2020-02-29 DIAGNOSIS — E669 Obesity, unspecified: Secondary | ICD-10-CM | POA: Diagnosis not present

## 2020-02-29 LAB — CBC AND DIFFERENTIAL
HCT: 38 (ref 36–46)
Hemoglobin: 13 (ref 12.0–16.0)
Platelets: 356 (ref 150–399)
WBC: 7.1

## 2020-02-29 LAB — CBC: RBC: 4.52 (ref 3.87–5.11)

## 2020-03-04 DIAGNOSIS — Z3046 Encounter for surveillance of implantable subdermal contraceptive: Secondary | ICD-10-CM | POA: Diagnosis not present

## 2020-04-21 ENCOUNTER — Encounter: Payer: Self-pay | Admitting: Family Medicine

## 2020-04-21 ENCOUNTER — Telehealth (INDEPENDENT_AMBULATORY_CARE_PROVIDER_SITE_OTHER): Payer: BC Managed Care – PPO | Admitting: Family Medicine

## 2020-04-21 DIAGNOSIS — R519 Headache, unspecified: Secondary | ICD-10-CM

## 2020-04-21 DIAGNOSIS — D649 Anemia, unspecified: Secondary | ICD-10-CM | POA: Diagnosis not present

## 2020-04-21 DIAGNOSIS — R112 Nausea with vomiting, unspecified: Secondary | ICD-10-CM | POA: Diagnosis not present

## 2020-04-21 MED ORDER — ONDANSETRON 4 MG PO TBDP: 4.0000 mg | ORAL_TABLET | Freq: Three times a day (TID) | ORAL | 0 refills | Status: DC | PRN

## 2020-04-21 MED ORDER — ZIPSOR 25 MG PO CAPS: ORAL_CAPSULE | ORAL | 0 refills | Status: DC

## 2020-04-21 MED ORDER — SUMATRIPTAN SUCCINATE 50 MG PO TABS: 50.0000 mg | ORAL_TABLET | ORAL | 0 refills | Status: DC | PRN

## 2020-04-21 NOTE — Progress Notes (Signed)
Virtual Visit via Video Note  I connected with Claire Schmidt  on 04/21/20 at 11:00 AM EDT by a video enabled telemedicine application and verified that I am speaking with the correct person using two identifiers.  Location patient: home, [place]  provider:work or home office Persons participating in the virtual visit: patient, provider  I discussed the limitations of evaluation and management by telemedicine and the availability of in person appointments. The patient expressed understanding and agreed to proceed.   HPI:  Acute visit for Migraines: -flare of her symptoms started about 2 weeks ago -she has a history of migraines though and reports at baseline she was having 15 or more headaches per month -she has had a headache daily, frontal and periorbital headache, throbbing pain, nausea this week, vomited once last week with a headache, light sensitivity and sound sensitivity -denies fevers, sinus issues, cough or congestion, SOB, weakness or numbness, vision changes, neck stiffness -she has a history of migraines with nausea and vomiting, she has tried excedrin, ibuprofen, tylenol - none of these ever help when she gets migraines -reports zofran has helped some with prior migraine episodes -she is seeing her ob for iron def anemia, she feels like the migraines have worsened during the menstrual cycles and with anemia from the heavy menstrual cycles -she had been on phentermine - but has  stopped it the last month -FDLMP 04/07/2020     ROS: See pertinent positives and negatives per HPI.  Past Medical History:  Diagnosis Date  . Nexplanon in place - jan 2019 12/11/2018  . Supraumbilical hernia     Past Surgical History:  Procedure Laterality Date  . CESAREAN SECTION N/A 08/26/2017   Procedure: CESAREAN SECTION;  Surgeon: Maxie Better, MD;  Location: [facility];  Service: Obstetrics;  Laterality: N/A;  . NO PAST SURGERIES    . UMBILICAL HERNIA REPAIR N/A 09/14/2019    Procedure: OPEN REPAIR INCARCERATED SUPRAUMBILICAL HERNIA;  Surgeon: Berna Bue, MD;  Location: [facility];  Service: General;  Laterality: N/A;    Family History  Problem Relation Age of Onset  . Dementia Maternal Grandmother   . Heart attack Paternal Grandmother   . Asthma Neg Hx   . Diabetes Neg Hx   . Heart disease Neg Hx   . Hyperlipidemia Neg Hx   . Hypertension Neg Hx     SOCIAL HX: see hpi   Current Outpatient Medications:  .  Levonorgest-Eth Estrad-Fe Bisg (BALCOLTRA) 0.1-20 MG-MCG(21) TABS, Take 1 tablet by mouth., Disp: , Rfl:  .  phentermine (ADIPEX-P) 37.5 MG tablet, SMARTSIG:.5 Tablet(s) By Mouth Every Morning, Disp: , Rfl:  .  Diclofenac Potassium (ZIPSOR) 25 MG CAPS, 25mg  PO bid prn pain, Disp: 30 capsule, Rfl: 0 .  ondansetron (ZOFRAN ODT) 4 MG disintegrating tablet, Take 1 tablet (4 mg total) by mouth every 8 (eight) hours as needed for nausea or vomiting., Disp: 20 tablet, Rfl: 0 .  SUMAtriptan (IMITREX) 50 MG tablet, Take 1 tablet (50 mg total) by mouth every 2 (two) hours as needed for migraine. May repeat in 2 hours if headache persists or recurs. Do not take more than 2 doses in 24 hours., Disp: 10 tablet, Rfl: 0  EXAM:  VITALS per patient if applicable:  GENERAL: alert, oriented, appears well and in no acute distress  HEENT: atraumatic, conjunttiva clear, no obvious abnormalities on inspection of external nose and ears  NECK: normal movements of the head and neck  LUNGS: on inspection no signs of  respiratory distress, breathing rate appears normal, no obvious gross SOB, gasping or wheezing  CV: no obvious cyanosis  MS: moves all visible extremities without noticeable abnormality  PSYCH/NEURO: pleasant and cooperative, no obvious depression or anxiety, speech and thought processing grossly intact  ASSESSMENT AND PLAN:  Discussed the following assessment and plan:  Frequent headaches  -we discussed possible serious and likely  etiologies, options for evaluation and workup, limitations of telemedicine visit vs in person visit, treatment, treatment risks and precautions. Pt prefers to treat via telemedicine empirically rather then risking or undertaking an in person visit at this moment. Reported history of migraines with worsening while dealing with menstrual issues and anemia. She opted to try zipsor for the pain and zofran for the nausea and vomiting. Then, trial of imitrex with close follow up with PCP for further evaluation and discussion of possible prophylactic medication if migraines confirmed given reported chronic frequency of her headaches. Advised follow up regarding her anemia with PCP or gyn and advised requesting CBC at pcp follow up to check levels sooner as she reports last check was 2 months ago. Sent message to PCP office to assist in scheduling follow up. Patient agrees to seek prompt in person care if worsening, new symptoms arise, or if is not improving with treatment.   I discussed the assessment and treatment plan with the patient. The patient was provided an opportunity to ask questions and all were answered. The patient agreed with the plan and demonstrated an understanding of the instructions.   The patient was advised to call back or seek an in-person evaluation if the symptoms worsen or if the condition fails to improve as anticipated.   Terressa Koyanagi, DO

## 2020-04-21 NOTE — Patient Instructions (Signed)
-can try the Zipsor per instructions and the zofran to try to stop this or ongoing migraines  -the sumatriptan is used to stop a migraine that is starting - it is designed to take at the first signs of a headache. May repeat in 2 hours once, but don't use more than two doses in 24 hours.  -You will needs close follow up with your Primary Doctor given the frequency of your headaches for further evaluation to confirm the diagnoses and decide on the best long term treatment. Please follow up with your doctor in the next 1-2 weeks. Please call you office to schedule.   I hope you are feeling better soon! Seek inperson care promptly if your symptoms worsen, new concerns arise or you are not improving with treatment.        Sumatriptan tablets What is this medicine? SUMATRIPTAN (soo ma TRIP tan) is used to treat migraines with or without aura. An aura is a strange feeling or visual disturbance that warns you of an attack. It is not used to prevent migraines. This medicine may be used for other purposes; ask your health care provider or pharmacist if you have questions. COMMON BRAND NAME(S): Imitrex, Migraine Pack What should I tell my health care provider before I take this medicine? They need to know if you have any of these conditions:  cigarette smoker  circulation problems in fingers and toes  diabetes  heart disease  high blood pressure  high cholesterol  history of irregular heartbeat  history of stroke  kidney disease  liver disease  stomach or intestine problems  an unusual or allergic reaction to sumatriptan, other medicines, foods, dyes, or preservatives  pregnant or trying to get pregnant  breast-feeding How should I use this medicine? Take this medicine by mouth with a glass of water. Follow the directions on the prescription label. Do not take it more often than directed. Talk to your pediatrician regarding the use of this medicine in children. Special care  may be needed. Overdosage: If you think you have taken too much of this medicine contact a poison control center or emergency room at once. NOTE: This medicine is only for you. Do not share this medicine with others. What if I miss a dose? This does not apply. This medicine is not for regular use. What may interact with this medicine? Do not take this medicine with any of the following medicines:  certain medicines for migraine headache like almotriptan, eletriptan, frovatriptan, naratriptan, rizatriptan, sumatriptan, zolmitriptan  ergot alkaloids like dihydroergotamine, ergonovine, ergotamine, methylergonovine  MAOIs like Carbex, Eldepryl, Marplan, Nardil, and Parnate This medicine may also interact with the following medications:  certain medicines for depression, anxiety, or psychotic disorders This list may not describe all possible interactions. Give your health care provider a list of all the medicines, herbs, non-prescription drugs, or dietary supplements you use. Also tell them if you smoke, drink alcohol, or use illegal drugs. Some items may interact with your medicine. What should I watch for while using this medicine? Visit your healthcare professional for regular checks on your progress. Tell your healthcare professional if your symptoms do not start to get better or if they get worse. You may get drowsy or dizzy. Do not drive, use machinery, or do anything that needs mental alertness until you know how this medicine affects you. Do not stand up or sit up quickly, especially if you are an older patient. This reduces the risk of dizzy or fainting spells. Alcohol may  interfere with the effect of this medicine. Tell your healthcare professional right away if you have any change in your eyesight. If you take migraine medicines for 10 or more days a month, your migraines may get worse. Keep a diary of headache days and medicine use. Contact your healthcare professional if your migraine  attacks occur more frequently. What side effects may I notice from receiving this medicine? Side effects that you should report to your doctor or health care professional as soon as possible:  allergic reactions like skin rash, itching or hives, swelling of the face, lips, or tongue  changes in vision  chest pain or chest tightness  signs and symptoms of a dangerous change in heartbeat or heart rhythm like chest pain; dizziness; fast, irregular heartbeat; palpitations; feeling faint or lightheaded; falls; breathing problems  signs and symptoms of a stroke like changes in vision; confusion; trouble speaking or understanding; severe headaches; sudden numbness or weakness of the face, arm or leg; trouble walking; dizziness; loss of balance or coordination  signs and symptoms of serotonin syndrome like irritable; confusion; diarrhea; fast or irregular heartbeat; muscle twitching; stiff muscles; trouble walking; sweating; high fever; seizures; chills; vomiting Side effects that usually do not require medical attention (report to your doctor or health care professional if they continue or are bothersome):  diarrhea  dizziness  drowsiness  dry mouth  headache  nausea, vomiting  pain, tingling, numbness in the hands or feet  stomach pain This list may not describe all possible side effects. Call your doctor for medical advice about side effects. You may report side effects to FDA at 1-800-FDA-1088. Where should I keep my medicine? Keep out of the reach of children. Store at room temperature between 2 and 30 degrees C (36 and 86 degrees F). Throw away any unused medicine after the expiration date. NOTE: This sheet is a summary. It may not cover all possible information. If you have questions about this medicine, talk to your doctor, pharmacist, or health care provider.  2020 Elsevier/Gold Standard (2018-04-08 15:05:37)  Diclofenac immediate-release capsules What is this  medicine? DICLOFENAC (dye KLOE fen ak) is a non-steroidal anti-inflammatory drug (NSAID). It is used to reduce swelling and to treat pain. It may be used to treat osteoarthritis, rheumatoid arthritis, mild to moderate pain, and painful monthly periods. This medicine may be used for other purposes; ask your health care provider or pharmacist if you have questions. COMMON BRAND NAME(S): Zipsor, Zorvolex What should I tell my health care provider before I take this medicine? They need to know if you have any of these conditions:  asthma, especially aspirin sensitive asthma  coronary artery bypass graft (CABG) surgery within the past 2 weeks  drink more than 3 alcohol containing drinks a day  heart disease or circulation problems like heart failure or leg edema (fluid retention)  high blood pressure  kidney disease  liver disease  stomach bleeding or ulcers  an unusual or allergic reaction to diclofenac, aspirin, other NSAIDs, bovine or cow proteins, other medicines, foods, dyes, or preservatives  pregnant or trying to get pregnant  breast-feeding How should I use this medicine? Take this medicine by mouth with a full glass of water. Follow the directions on the prescription label. Take your medicine at regular intervals. Do not take your medicine more often than directed. Long-term, continuous use may increase the risk of heart attack or stroke. A special MedGuide will be given to you by the pharmacist with each prescription and refill.  Be sure to read this information carefully each time. Talk to your pediatrician regarding the use of this medicine in children. Special care may be needed. Elderly patients over 23 years old may have a stronger reaction and need a smaller dose. Overdosage: If you think you have taken too much of this medicine contact a poison control center or emergency room at once. NOTE: This medicine is only for you. Do not share this medicine with others. What if I  miss a dose? If you miss a dose, take it as soon as you can. If it is almost time for your next dose, take only that dose. Do not take double or extra doses. What may interact with this medicine? Do not take this medicine with any of the following medications:  cidofovir  ketorolac  methotrexate This medicine may also interact with the following medications:  alcohol  aspirin and aspirin-like medicines  certain medicines for blood pressure  certain medicines for depression, like fluoxetine, sertraline  certain medicines that treat or prevent blood clots like warfarin, enoxaparin, dalteparin, apixaban, dabigatran, and rivaroxaban  cyclosporine  digoxin  diuretics  lithium  medicines for osteoporosis  NSAIDs, medicines for pain and inflammation, like ibuprofen or naproxen  pemetrexed  rifampin  steroid medicines like prednisone or cortisone  voriconazole This list may not describe all possible interactions. Give your health care provider a list of all the medicines, herbs, non-prescription drugs, or dietary supplements you use. Also tell them if you smoke, drink alcohol, or use illegal drugs. Some items may interact with your medicine. What should I watch for while using this medicine? Tell your doctor or healthcare provider if your pain does not get better. Talk to your doctor before taking another medicine for pain. Do not treat yourself. This medicine may cause serious skin reactions. They can happen weeks to months after starting the medicine. Contact your healthcare provider right away if you notice fevers or flu-like symptoms with a rash. The rash may be red or purple and then turn into blisters or peeling of the skin. Or, you might notice a red rash with swelling of the face, lips or lymph nodes in your neck or under your arms. This medicine does not prevent heart attack or stroke. In fact, this medicine may increase the chance of a heart attack or stroke. The chance  may increase with longer use of this medicine and in people who have heart disease. If you take aspirin to prevent heart attack or stroke, talk with your doctor or healthcare provider. Do not take medicines such as ibuprofen and naproxen with this medicine. Side effects such as stomach upset, nausea, or ulcers may be more likely to occur. Many medicines available without a prescription should not be taken with this medicine. This medicine can cause ulcers and bleeding in the stomach and intestines at any time during treatment. Do not smoke cigarettes or drink alcohol. These increase irritation to your stomach and can make it more susceptible to damage from this medicine. Ulcers and bleeding can happen without warning symptoms and can cause death. You may get drowsy or dizzy. Do not drive, use machinery, or do anything that needs mental alertness until you know how this medicine affects you. Do not stand or sit up quickly, especially if you are an older patient. This reduces the risk of dizzy or fainting spells. This medicine can cause you to bleed more easily. Try to avoid damage to your teeth and gums when you brush or  floss your teeth. In females, this medicine may temporarily make it more difficult to get pregnant. Talk with your doctor or healthcare provider if you are concerned about your fertility. What side effects may I notice from receiving this medicine? Side effects that you should report to your doctor or health care professional as soon as possible:  allergic reactions like skin rash, itching or hives, swelling of the face, lips, or tongue  chest pain or chest tightness  difficulty breathing or wheezing  rash, fever, and swollen lymph nodes  redness, blistering, peeling or loosening of the skin, including inside the mouth  signs and symptoms of bleeding such as bloody or black, tarry stools; red or dark brown urine; spitting up blood or brown material that looks like coffee grounds;  red spots on the skin; unusual bruising or bleeding from the eye, gums, or nose  signs and symptoms of liver injury like dark yellow or brown urine; general ill feeling or flu-like symptoms; light-colored stools; loss of appetite; nausea; right upper belly pain; unusually weak or tired; yellowing of the eyes or skin  signs and symptoms of kidney injury like trouble passing urine or change in the amount of urine  signs and symptoms of a stroke like changes in vision; confusion; trouble speaking or understanding; severe headaches; sudden numbness or weakness of the face, arm or leg; trouble walking; dizziness; loss of balance or coordination  unexplained weight gain or swelling Side effects that usually do not require medical attention (report to your doctor or health care professional if they continue or are bothersome):  constipation  diarrhea  dizziness  headache  heartburn This list may not describe all possible side effects. Call your doctor for medical advice about side effects. You may report side effects to FDA at 1-800-FDA-1088. Where should I keep my medicine? Keep out of the reach of children. Store at room temperature between 15 and 30 degrees C (59 and 86 degrees F). Protect from moisture. Throw away any unused medicine after the expiration date. NOTE: This sheet is a summary. It may not cover all possible information. If you have questions about this medicine, talk to your doctor, pharmacist, or health care provider.  2020 Elsevier/Gold Standard (2018-12-10 11:36:22)

## 2020-04-26 ENCOUNTER — Telehealth: Payer: Self-pay | Admitting: *Deleted

## 2020-04-26 MED ORDER — DICLOFENAC SODIUM 25 MG PO TBEC: DELAYED_RELEASE_TABLET | ORAL | 0 refills | Status: DC

## 2020-04-26 NOTE — Telephone Encounter (Signed)
Ok to send diclofenac 25mg  tabs, 1 tab bid prn migraine pain, #20. No RF. Call pt to Advise not to take with any OTC pain medications. Please let pt know pharmacy did not have her medication and that I just got this message from the pharmacy today. Sorry for the delay and hope she is feeling better!

## 2020-04-26 NOTE — Telephone Encounter (Signed)
Per the pharmacist Thayer Ohm, Diclofenac or Voltaren can be given.  Message sent to Dr Selena Batten.

## 2020-04-26 NOTE — Telephone Encounter (Signed)
[facility] faxed a note stating the Rx for Diclofenac Potassium (Zipsor) has been discontinued and requested an alternative be sent in.  Message sent to Dr Selena Batten as this was prescribed by her.

## 2020-04-26 NOTE — Telephone Encounter (Signed)
Can you check with the pharmacy to see what alternatives they have? Then let me know? Thanks!

## 2020-04-26 NOTE — Telephone Encounter (Signed)
Rx done and I left a detailed message at the pts cell number with the information below. 

## 2020-05-18 ENCOUNTER — Other Ambulatory Visit: Payer: Self-pay

## 2020-05-18 ENCOUNTER — Encounter: Payer: Self-pay | Admitting: Family Medicine

## 2020-05-18 ENCOUNTER — Ambulatory Visit (INDEPENDENT_AMBULATORY_CARE_PROVIDER_SITE_OTHER): Payer: BC Managed Care – PPO | Admitting: Family Medicine

## 2020-05-18 VITALS — BP 106/72 | HR 66 | Temp 98.1°F | Ht 61.0 in | Wt 200.8 lb

## 2020-05-18 DIAGNOSIS — R519 Headache, unspecified: Secondary | ICD-10-CM

## 2020-05-18 NOTE — Patient Instructions (Signed)
Please return for your complete physical  I'd like you to see a headache specialist to help clarify what type of headaches you are having.  Try the diclofenac for your headaches. Imitrex IF they are associated with nausea or light sensitivity to see if it helps.   If you have any questions or concerns, please don't hesitate to send me a message via MyChart or call the office at [phone]. Thank you for visiting with [place] today! It's our pleasure caring for you.   General Headache Without Cause A headache is pain or discomfort felt around the head or neck area. The specific cause of a headache may not be found. There are many causes and types of headaches. A few common ones are:  Tension headaches.  Migraine headaches.  Cluster headaches.  Chronic daily headaches. Follow these instructions at home: Watch your condition for any changes. Let your health care provider know about them. Take these steps to help with your condition: Managing pain      Take over-the-counter and prescription medicines only as told by your health care provider.  Lie down in a dark, quiet room when you have a headache.  If directed, put ice on your head and neck area: ? Put ice in a plastic bag. ? Place a towel between your skin and the bag. ? Leave the ice on for 20 minutes, 2-3 times per day.  If directed, apply heat to the affected area. Use the heat source that your health care provider recommends, such as a moist heat pack or a heating pad. ? Place a towel between your skin and the heat source. ? Leave the heat on for 20-30 minutes. ? Remove the heat if your skin turns bright red. This is especially important if you are unable to feel pain, heat, or cold. You may have a greater risk of getting burned.  Keep lights dim if bright lights bother you or make your headaches worse. Eating and drinking  Eat meals on a regular schedule.  If you drink alcohol: ? Limit how much you use to:  0-1 drink a  day for women.  0-2 drinks a day for men. ? Be aware of how much alcohol is in your drink. In the [place] one drink equals one 12 oz bottle of beer (355 mL), one 5 oz glass of wine (148 mL), or one 1 oz glass of hard liquor (44 mL).  Stop drinking caffeine, or decrease the amount of caffeine you drink. General instructions   Keep a headache journal to help find out what may trigger your headaches. For example, write down: ? What you eat and drink. ? How much sleep you get. ? Any change to your diet or medicines.  Try massage or other relaxation techniques.  Limit stress.  Sit up straight, and do not tense your muscles.  Do not use any products that contain nicotine or tobacco, such as cigarettes, e-cigarettes, and chewing tobacco. If you need help quitting, ask your health care provider.  Exercise regularly as told by your health care provider.  Sleep on a regular schedule. Get 7-9 hours of sleep each night, or the amount recommended by your health care provider.  Keep all follow-up visits as told by your health care provider. This is important. Contact a health care provider if:  Your symptoms are not helped by medicine.  You have a headache that is different from the usual headache.  You have nausea or you vomit.  You have a  fever. Get help right away if:  Your headache becomes severe quickly.  Your headache gets worse after moderate to intense physical activity.  You have repeated vomiting.  You have a stiff neck.  You have a loss of vision.  You have problems with speech.  You have pain in the eye or ear.  You have muscular weakness or loss of muscle control.  You lose your balance or have trouble walking.  You feel faint or pass out.  You have confusion.  You have a seizure. Summary  A headache is pain or discomfort felt around the head or neck area.  There are many causes and types of headaches. In some cases, the cause may not be found.  Keep a  headache journal to help find out what may trigger your headaches. Watch your condition for any changes. Let your health care provider know about them.  Contact a health care provider if you have a headache that is different from the usual headache, or if your symptoms are not helped by medicine.  Get help right away if your headache becomes severe, you vomit, you have a loss of vision, you lose your balance, or you have a seizure. This information is not intended to replace advice given to you by your health care provider. Make sure you discuss any questions you have with your health care provider. Document Revised: 04/14/2018 Document Reviewed: 04/14/2018 Elsevier Patient Education  2020 [employer].

## 2020-05-18 NOTE — Progress Notes (Signed)
Subjective  CC:  Chief Complaint  Patient presents with  . Migraine    experiences often, nothing OTC helps      HPI: Claire Schmidt is a 24 y.o. female who presents to the office today to address the problems listed above in the chief complaint.  24 year old presents due to 67-month history of almost daily headaches.  I reviewed charts.  She has been to an urgent care and had a video visit recently for painful headaches.  Patient reports that she has suffered for headache since the age of 69 or so.  She is never been diagnosed with migraines per se.  Over the last several years, however they had been very intermittent.  6 months ago, things changed and she started having more frequent headaches.  They described as painful throbbing bifrontal, worse in the morning, typically lasting 1 to 4 hours and usually resolve with over-the-counter medicines.  However over the last 1 to 2 months she has had worsening headaches, more painful and at times associated with light sensitivity.  She is also had one or two episodes of nausea.  She denies diplopia, dysarthria, sinus pain, tearing, vision changes.  She was treated in the emergency room with Toradol and Phenergan for her muscular headache and that resolved.  Currently, she awakens daily with headaches.  She rarely needs to take medicines for them, she prefers not to take pills if not needed.  She was recently prescribed diclofenac and Imitrex but has not used them.  There is no family history of headaches.  She does not awaken from sleep with a severe headache.  She has no neck pain, fevers, chills.  She denies known triggers.  No increased stress.  She does work on a computer all day and thinks she might have eyestrain that could be contributing.  Assessment  1. Chronic daily headache      Plan   Chronic daily headaches: Trial of diclofenac's.  Most are not typical of migraines although she does have some migrainous components for couple of her  headaches.  Recommend headache log, referral to neurology, further clarification of headaches as needed, consider pseudotumor cerebri although there is no papilledema on exam today.  Referral placed.  Discussed red flags.  To ER if severe headache develops.  Follow up: Schedule CPE   Orders Placed This Encounter  Procedures  . AMB referral to headache clinic   No orders of the defined types were placed in this encounter.     I reviewed the patients updated PMH, FH, and SocHx.    Patient Active Problem List   Diagnosis Date Noted  . Nexplanon in place - jan 2019 12/11/2018  . Constipation 12/11/2018  . H/O allergic urticaria 12/02/2014  . Atopic dermatitis 05/18/2014  . Allergic rhinitis 03/22/2014   Current Meds  Medication Sig  . diclofenac (VOLTAREN) 25 MG EC tablet Take 1 tablet twice a day as needed for migraine  . Diclofenac Potassium (ZIPSOR) 25 MG CAPS 25mg  PO bid prn pain  . ondansetron (ZOFRAN ODT) 4 MG disintegrating tablet Take 1 tablet (4 mg total) by mouth every 8 (eight) hours as needed for nausea or vomiting.  . SUMAtriptan (IMITREX) 50 MG tablet Take 1 tablet (50 mg total) by mouth every 2 (two) hours as needed for migraine. May repeat in 2 hours if headache persists or recurs. Do not take more than 2 doses in 24 hours.    Allergies: Patient is allergic to bee venom. Family History:  Patient family history includes Dementia in her maternal grandmother; Heart attack in her paternal grandmother. Social History:  Patient  reports that she has never smoked. She has never used smokeless tobacco. She reports that she does not drink alcohol and does not use drugs.  Review of Systems: Constitutional: Negative for fever malaise or anorexia Cardiovascular: negative for chest pain Respiratory: negative for SOB or persistent cough Gastrointestinal: negative for abdominal pain  Objective  Vitals: BP 106/72   Pulse 66   Temp 98.1 F (36.7 C) (Temporal)   Ht 5\' 1"   (1.549 m)   Wt 200 lb 12.8 oz (91.1 kg)   SpO2 99%   BMI 37.94 kg/m  General: no acute distress , A&Ox3, appears well HEENT: PEERL, normal optic nerves bilaterally, no papilledema, conjunctiva normal, neck is supple Cardiovascular:  RRR without murmur or gallop.  Respiratory:  Good breath sounds bilaterally, CTAB with normal respiratory effort Skin:  Warm, no rashes Nonfocal neuro exam.     Commons side effects, risks, benefits, and alternatives for medications and treatment plan prescribed today were discussed, and the patient expressed understanding of the given instructions. Patient is instructed to call or message via MyChart if he/she has any questions or concerns regarding our treatment plan. No barriers to understanding were identified. We discussed Red Flag symptoms and signs in detail. Patient expressed understanding regarding what to do in case of urgent or emergency type symptoms.   Medication list was reconciled, printed and provided to the patient in AVS. Patient instructions and summary information was reviewed with the patient as documented in the AVS. This note was prepared with assistance of Dragon voice recognition software. Occasional wrong-word or sound-a-like substitutions may have occurred due to the inherent limitations of voice recognition software  This visit occurred during the SARS-CoV-2 public health emergency.  Safety protocols were in place, including screening questions prior to the visit, additional usage of staff PPE, and extensive cleaning of exam room while observing appropriate contact time as indicated for disinfecting solutions.

## 2020-05-24 ENCOUNTER — Telehealth: Payer: Self-pay

## 2020-05-24 NOTE — Telephone Encounter (Signed)
error 

## 2020-05-27 DIAGNOSIS — Z20822 Contact with and (suspected) exposure to covid-19: Secondary | ICD-10-CM | POA: Diagnosis not present

## 2020-05-30 DIAGNOSIS — G43719 Chronic migraine without aura, intractable, without status migrainosus: Secondary | ICD-10-CM | POA: Diagnosis not present

## 2020-05-30 DIAGNOSIS — Z79899 Other long term (current) drug therapy: Secondary | ICD-10-CM | POA: Diagnosis not present

## 2020-05-30 DIAGNOSIS — Z049 Encounter for examination and observation for unspecified reason: Secondary | ICD-10-CM | POA: Diagnosis not present

## 2020-05-30 DIAGNOSIS — G43839 Menstrual migraine, intractable, without status migrainosus: Secondary | ICD-10-CM | POA: Diagnosis not present

## 2020-06-02 ENCOUNTER — Encounter: Payer: Self-pay | Admitting: Family Medicine

## 2020-06-03 NOTE — Telephone Encounter (Signed)
Pt information

## 2020-06-09 ENCOUNTER — Telehealth: Payer: Self-pay

## 2020-06-09 NOTE — Telephone Encounter (Signed)
LMOVM for return call regarding leave of absence paperwork.

## 2020-06-20 ENCOUNTER — Telehealth: Payer: Self-pay | Admitting: Family Medicine

## 2020-06-20 NOTE — Telephone Encounter (Signed)
The Claire Schmidt is calling in asking if we have received the short term disability papers they faxed over on 06/14/20, asked for a call back.

## 2020-06-22 NOTE — Telephone Encounter (Signed)
I have placed forms in your basket to complete and sign

## 2020-06-23 NOTE — Telephone Encounter (Signed)
Paperwork faxed and patient aware to pick up

## 2020-07-07 ENCOUNTER — Encounter: Payer: Self-pay | Admitting: Family Medicine

## 2020-07-08 ENCOUNTER — Telehealth: Payer: Self-pay

## 2020-07-08 NOTE — Telephone Encounter (Signed)
Pt states she sent forms to Dr. Mardelle Matte through MyChart for her to fill out for migraines. Pt states Dr. Mardelle Matte told her she needed to have her specialist fill out the paperwork. Pt states her specialist told her he will only fill out FMLA paperwork, not paperwork for work accommodations. Pt states the specialist told her to have PCP fill out paperwork. Please advise.

## 2020-07-08 NOTE — Telephone Encounter (Signed)
Patient aware that Dr. Neale Burly is agreeable to filling out needed paperwork at her visit on 10/6

## 2020-07-08 NOTE — Telephone Encounter (Signed)
I spoke with patient over the phone to clarify.   I discussed her care with her neurology office.   Please call her. She has a follow up appointment with Dr. Gaynell Face on October 6th. At that time, she can discuss her request for work accommodations with him.   We both concur that any special needs that are required due to her headaches would need to come from Dr. Gaynell Face.

## 2020-07-08 NOTE — Telephone Encounter (Signed)
Please advise. I know you have already has a discussion with Ms. Bacchi via MyChart

## 2020-07-11 ENCOUNTER — Telehealth: Payer: Self-pay

## 2020-07-11 NOTE — Telephone Encounter (Signed)
Paperwork was placed in bin upfront for patient to pick up when I faxed to her work a few weeks ago.

## 2020-07-11 NOTE — Telephone Encounter (Signed)
Pt states there was paperwork faxed to her job a few weeks ago. However, her job is saying they never received it. Pt is following up on this.

## 2020-07-12 NOTE — Telephone Encounter (Signed)
Lvm for pt to let her know she can come pick up paperwork

## 2020-07-13 DIAGNOSIS — G43719 Chronic migraine without aura, intractable, without status migrainosus: Secondary | ICD-10-CM | POA: Diagnosis not present

## 2020-07-13 DIAGNOSIS — G43839 Menstrual migraine, intractable, without status migrainosus: Secondary | ICD-10-CM | POA: Diagnosis not present

## 2020-07-14 ENCOUNTER — Encounter: Payer: Self-pay | Admitting: Family Medicine

## 2020-07-20 ENCOUNTER — Ambulatory Visit (INDEPENDENT_AMBULATORY_CARE_PROVIDER_SITE_OTHER): Payer: BC Managed Care – PPO | Admitting: Family Medicine

## 2020-07-20 ENCOUNTER — Other Ambulatory Visit: Payer: Self-pay

## 2020-07-20 ENCOUNTER — Encounter: Payer: Self-pay | Admitting: Family Medicine

## 2020-07-20 VITALS — BP 118/70 | HR 114 | Temp 97.9°F | Wt 207.6 lb

## 2020-07-20 DIAGNOSIS — R519 Headache, unspecified: Secondary | ICD-10-CM | POA: Diagnosis not present

## 2020-07-20 NOTE — Progress Notes (Signed)
Subjective  CC:  Chief Complaint  Patient presents with  . Migraine    HPI: Claire Schmidt is a 24 y.o. female who presents to the office today to address the problems listed above in the chief complaint.  24 yo with chronic headaches seeing Dr. Neale Burly from the headache clinic. I have reviewed his records from the last 2 office visits.   I have started FMLA for her but she is not eligible since she has not worked at her current job long enough.   She is requesting work accommodations to work from home due to [employer] from medications, daily headaches associated with nausea. Reports work noises and computer screens at work worsen her headaches.   She apparently has been out of work for some time now.  Dr. Neale Burly did not specifically recommend ANY work accomodation be made for her.  Assessment  1. Chronic daily headache   2. Frequent headaches      Plan   headaches:  We reviewed her case and findings. I completed a 4 week course for work accomodation in order for her to continue seeing the headache specialist and allow the treatment to improve her symptoms.   She should return to work w/o restrictions after 4 weeks.   Dr. Neale Burly to make any further recommendations regarding her headache mgt and ability to work.   Pt agrees.   Follow up:   10/12/2020  No orders of the defined types were placed in this encounter.  No orders of the defined types were placed in this encounter.     I reviewed the patients updated PMH, FH, and SocHx.    Patient Active Problem List   Diagnosis Date Noted  . Nexplanon in place - jan 2019 12/11/2018  . Constipation 12/11/2018  . H/O allergic urticaria 12/02/2014  . Atopic dermatitis 05/18/2014  . Allergic rhinitis 03/22/2014   Current Meds  Medication Sig  . diclofenac (VOLTAREN) 25 MG EC tablet Take 1 tablet twice a day as needed for migraine  . Diclofenac Potassium (ZIPSOR) 25 MG CAPS 25mg  PO bid prn pain  . ondansetron (ZOFRAN  ODT) 4 MG disintegrating tablet Take 1 tablet (4 mg total) by mouth every 8 (eight) hours as needed for nausea or vomiting.  . SUMAtriptan (IMITREX) 50 MG tablet Take 1 tablet (50 mg total) by mouth every 2 (two) hours as needed for migraine. May repeat in 2 hours if headache persists or recurs. Do not take more than 2 doses in 24 hours.  zonisamide (ZONEGRAN) 25 MG capsule Take 25 mg by mouth daily.    Allergies: Patient is allergic to bee venom. Family History: Patient family history includes Dementia in her maternal grandmother; Heart attack in her paternal grandmother. Social History:  Patient  reports that she has never smoked. She has never used smokeless tobacco. She reports that she does not drink alcohol and does not use drugs.  Review of Systems: Constitutional: Negative for fever malaise or anorexia Cardiovascular: negative for chest pain Respiratory: negative for SOB or persistent cough Gastrointestinal: negative for abdominal pain  Objective  Vitals: BP 118/70   Pulse (!) 114   Temp 97.9 F (36.6 C) (Temporal)   Wt 207 lb 9.6 oz (94.2 kg)   SpO2 98%   BMI 39.23 kg/m   Upon entering room, a strong similar to marijuana is noted. General: no acute distress , pt is sedated and sluggish HEENT: enlarged pupils noted from across room  Pt declines UDS>  Commons side effects, risks, benefits, and alternatives for medications and treatment plan prescribed today were discussed, and the patient expressed understanding of the given instructions. Patient is instructed to call or message via MyChart if he/she has any questions or concerns regarding our treatment plan. No barriers to understanding were identified. We discussed Red Flag symptoms and signs in detail. Patient expressed understanding regarding what to do in case of urgent or emergency type symptoms.   Medication list was reconciled, printed and provided to the patient in AVS. Patient instructions and summary  information was reviewed with the patient as documented in the AVS. This note was prepared with assistance of Dragon voice recognition software. Occasional wrong-word or sound-a-like substitutions may have occurred due to the inherent limitations of voice recognition software  This visit occurred during the SARS-CoV-2 public health emergency.  Safety protocols were in place, including screening questions prior to the visit, additional usage of staff PPE, and extensive cleaning of exam room while observing appropriate contact time as indicated for disinfecting solutions.

## 2020-08-16 DIAGNOSIS — Z3201 Encounter for pregnancy test, result positive: Secondary | ICD-10-CM | POA: Diagnosis not present

## 2020-08-18 DIAGNOSIS — N926 Irregular menstruation, unspecified: Secondary | ICD-10-CM | POA: Diagnosis not present

## 2020-08-24 DIAGNOSIS — N912 Amenorrhea, unspecified: Secondary | ICD-10-CM | POA: Diagnosis not present

## 2020-08-31 DIAGNOSIS — Z3200 Encounter for pregnancy test, result unknown: Secondary | ICD-10-CM | POA: Diagnosis not present

## 2020-08-31 DIAGNOSIS — Z98891 History of uterine scar from previous surgery: Secondary | ICD-10-CM | POA: Diagnosis not present

## 2020-09-22 DIAGNOSIS — Z3481 Encounter for supervision of other normal pregnancy, first trimester: Secondary | ICD-10-CM | POA: Diagnosis not present

## 2020-09-28 DIAGNOSIS — Z3481 Encounter for supervision of other normal pregnancy, first trimester: Secondary | ICD-10-CM | POA: Diagnosis not present

## 2020-10-12 ENCOUNTER — Encounter: Payer: BC Managed Care – PPO | Admitting: Family Medicine

## 2020-10-22 DIAGNOSIS — Z03818 Encounter for observation for suspected exposure to other biological agents ruled out: Secondary | ICD-10-CM | POA: Diagnosis not present

## 2020-10-22 DIAGNOSIS — Z20822 Contact with and (suspected) exposure to covid-19: Secondary | ICD-10-CM | POA: Diagnosis not present

## 2020-10-28 DIAGNOSIS — O34219 Maternal care for unspecified type scar from previous cesarean delivery: Secondary | ICD-10-CM | POA: Diagnosis not present

## 2020-10-28 DIAGNOSIS — Z3482 Encounter for supervision of other normal pregnancy, second trimester: Secondary | ICD-10-CM | POA: Diagnosis not present

## 2020-11-18 ENCOUNTER — Encounter: Payer: Self-pay | Admitting: Family Medicine

## 2020-12-01 DIAGNOSIS — O34219 Maternal care for unspecified type scar from previous cesarean delivery: Secondary | ICD-10-CM | POA: Diagnosis not present

## 2020-12-01 DIAGNOSIS — Z369 Encounter for antenatal screening, unspecified: Secondary | ICD-10-CM | POA: Diagnosis not present

## 2020-12-01 DIAGNOSIS — Z3482 Encounter for supervision of other normal pregnancy, second trimester: Secondary | ICD-10-CM | POA: Diagnosis not present

## 2020-12-01 DIAGNOSIS — O368990 Maternal care for other specified fetal problems, unspecified trimester, not applicable or unspecified: Secondary | ICD-10-CM | POA: Diagnosis not present

## 2020-12-01 DIAGNOSIS — Z3689 Encounter for other specified antenatal screening: Secondary | ICD-10-CM | POA: Diagnosis not present

## 2020-12-12 ENCOUNTER — Other Ambulatory Visit: Payer: Self-pay | Admitting: Obstetrics and Gynecology

## 2020-12-12 ENCOUNTER — Telehealth: Payer: Self-pay

## 2020-12-12 DIAGNOSIS — Z3A20 20 weeks gestation of pregnancy: Secondary | ICD-10-CM

## 2020-12-12 DIAGNOSIS — Z363 Encounter for antenatal screening for malformations: Secondary | ICD-10-CM

## 2020-12-12 DIAGNOSIS — O43193 Other malformation of placenta, third trimester: Secondary | ICD-10-CM

## 2020-12-12 NOTE — Telephone Encounter (Signed)
Left vm for pt re scheduling detail [place] for Umbilical Cord Insertion Cyst. We have an available appointment tomorrow - but I told her to call back to see if that could work for her.

## 2020-12-13 ENCOUNTER — Ambulatory Visit: Payer: BC Managed Care – PPO | Attending: Obstetrics and Gynecology

## 2020-12-13 ENCOUNTER — Encounter: Payer: Self-pay | Admitting: *Deleted

## 2020-12-13 ENCOUNTER — Ambulatory Visit: Payer: BC Managed Care – PPO | Admitting: *Deleted

## 2020-12-13 ENCOUNTER — Other Ambulatory Visit: Payer: Self-pay

## 2020-12-13 VITALS — BP 109/58 | HR 78

## 2020-12-13 DIAGNOSIS — O321XX0 Maternal care for breech presentation, not applicable or unspecified: Secondary | ICD-10-CM | POA: Diagnosis not present

## 2020-12-13 DIAGNOSIS — O43193 Other malformation of placenta, third trimester: Secondary | ICD-10-CM | POA: Diagnosis not present

## 2020-12-13 DIAGNOSIS — O34219 Maternal care for unspecified type scar from previous cesarean delivery: Secondary | ICD-10-CM

## 2020-12-13 DIAGNOSIS — Z3A20 20 weeks gestation of pregnancy: Secondary | ICD-10-CM

## 2020-12-13 DIAGNOSIS — Z363 Encounter for antenatal screening for malformations: Secondary | ICD-10-CM | POA: Diagnosis not present

## 2020-12-13 DIAGNOSIS — O283 Abnormal ultrasonic finding on antenatal screening of mother: Secondary | ICD-10-CM

## 2020-12-13 DIAGNOSIS — O99212 Obesity complicating pregnancy, second trimester: Secondary | ICD-10-CM | POA: Diagnosis not present

## 2020-12-13 DIAGNOSIS — O368920 Maternal care for other specified fetal problems, second trimester, not applicable or unspecified: Secondary | ICD-10-CM | POA: Diagnosis not present

## 2020-12-13 DIAGNOSIS — E669 Obesity, unspecified: Secondary | ICD-10-CM

## 2020-12-29 DIAGNOSIS — Z3482 Encounter for supervision of other normal pregnancy, second trimester: Secondary | ICD-10-CM | POA: Diagnosis not present

## 2020-12-29 DIAGNOSIS — O368990 Maternal care for other specified fetal problems, unspecified trimester, not applicable or unspecified: Secondary | ICD-10-CM | POA: Diagnosis not present

## 2020-12-29 DIAGNOSIS — O34219 Maternal care for unspecified type scar from previous cesarean delivery: Secondary | ICD-10-CM | POA: Diagnosis not present

## 2021-01-26 DIAGNOSIS — Z3689 Encounter for other specified antenatal screening: Secondary | ICD-10-CM | POA: Diagnosis not present

## 2021-02-09 ENCOUNTER — Other Ambulatory Visit: Payer: Self-pay | Admitting: Obstetrics and Gynecology

## 2021-02-09 DIAGNOSIS — O368990 Maternal care for other specified fetal problems, unspecified trimester, not applicable or unspecified: Secondary | ICD-10-CM | POA: Diagnosis not present

## 2021-02-09 DIAGNOSIS — Z3A32 32 weeks gestation of pregnancy: Secondary | ICD-10-CM

## 2021-02-09 DIAGNOSIS — O34219 Maternal care for unspecified type scar from previous cesarean delivery: Secondary | ICD-10-CM | POA: Diagnosis not present

## 2021-02-09 DIAGNOSIS — Z3483 Encounter for supervision of other normal pregnancy, third trimester: Secondary | ICD-10-CM | POA: Diagnosis not present

## 2021-02-09 DIAGNOSIS — Z3689 Encounter for other specified antenatal screening: Secondary | ICD-10-CM | POA: Diagnosis not present

## 2021-02-16 ENCOUNTER — Ambulatory Visit: Payer: BC Managed Care – PPO | Attending: Obstetrics and Gynecology

## 2021-02-16 ENCOUNTER — Other Ambulatory Visit: Payer: Self-pay

## 2021-02-16 ENCOUNTER — Ambulatory Visit: Payer: BC Managed Care – PPO | Admitting: *Deleted

## 2021-02-16 ENCOUNTER — Other Ambulatory Visit: Payer: Self-pay | Admitting: *Deleted

## 2021-02-16 ENCOUNTER — Encounter: Payer: Self-pay | Admitting: *Deleted

## 2021-02-16 VITALS — BP 108/64 | HR 97

## 2021-02-16 DIAGNOSIS — Z362 Encounter for other antenatal screening follow-up: Secondary | ICD-10-CM | POA: Diagnosis not present

## 2021-02-16 DIAGNOSIS — Z3A30 30 weeks gestation of pregnancy: Secondary | ICD-10-CM

## 2021-02-16 DIAGNOSIS — O368990 Maternal care for other specified fetal problems, unspecified trimester, not applicable or unspecified: Secondary | ICD-10-CM

## 2021-02-16 DIAGNOSIS — Z3A32 32 weeks gestation of pregnancy: Secondary | ICD-10-CM | POA: Diagnosis not present

## 2021-02-16 DIAGNOSIS — O99212 Obesity complicating pregnancy, second trimester: Secondary | ICD-10-CM | POA: Diagnosis not present

## 2021-02-16 DIAGNOSIS — O34219 Maternal care for unspecified type scar from previous cesarean delivery: Secondary | ICD-10-CM

## 2021-02-16 DIAGNOSIS — E669 Obesity, unspecified: Secondary | ICD-10-CM

## 2021-02-16 DIAGNOSIS — Z6841 Body Mass Index (BMI) 40.0 and over, adult: Secondary | ICD-10-CM

## 2021-02-16 DIAGNOSIS — O43193 Other malformation of placenta, third trimester: Secondary | ICD-10-CM | POA: Diagnosis not present

## 2021-02-23 DIAGNOSIS — O34219 Maternal care for unspecified type scar from previous cesarean delivery: Secondary | ICD-10-CM | POA: Diagnosis not present

## 2021-02-23 DIAGNOSIS — O368990 Maternal care for other specified fetal problems, unspecified trimester, not applicable or unspecified: Secondary | ICD-10-CM | POA: Diagnosis not present

## 2021-02-23 DIAGNOSIS — Z3483 Encounter for supervision of other normal pregnancy, third trimester: Secondary | ICD-10-CM | POA: Diagnosis not present

## 2021-03-13 DIAGNOSIS — Z3483 Encounter for supervision of other normal pregnancy, third trimester: Secondary | ICD-10-CM | POA: Diagnosis not present

## 2021-03-13 DIAGNOSIS — O368990 Maternal care for other specified fetal problems, unspecified trimester, not applicable or unspecified: Secondary | ICD-10-CM | POA: Diagnosis not present

## 2021-03-13 DIAGNOSIS — O34219 Maternal care for unspecified type scar from previous cesarean delivery: Secondary | ICD-10-CM | POA: Diagnosis not present

## 2021-03-22 DIAGNOSIS — Z3685 Encounter for antenatal screening for Streptococcus B: Secondary | ICD-10-CM | POA: Diagnosis not present

## 2021-03-22 DIAGNOSIS — Z3483 Encounter for supervision of other normal pregnancy, third trimester: Secondary | ICD-10-CM | POA: Diagnosis not present

## 2021-03-22 DIAGNOSIS — O368990 Maternal care for other specified fetal problems, unspecified trimester, not applicable or unspecified: Secondary | ICD-10-CM | POA: Diagnosis not present

## 2021-03-22 DIAGNOSIS — O34219 Maternal care for unspecified type scar from previous cesarean delivery: Secondary | ICD-10-CM | POA: Diagnosis not present

## 2021-03-23 ENCOUNTER — Ambulatory Visit: Payer: BC Managed Care – PPO

## 2021-03-23 ENCOUNTER — Ambulatory Visit: Payer: BC Managed Care – PPO | Attending: Maternal & Fetal Medicine

## 2021-03-30 ENCOUNTER — Ambulatory Visit: Payer: BC Managed Care – PPO | Attending: Maternal & Fetal Medicine

## 2021-03-30 ENCOUNTER — Other Ambulatory Visit: Payer: Self-pay

## 2021-03-30 ENCOUNTER — Ambulatory Visit: Payer: BC Managed Care – PPO

## 2021-03-30 ENCOUNTER — Encounter (HOSPITAL_COMMUNITY): Payer: Self-pay | Admitting: Obstetrics and Gynecology

## 2021-03-30 ENCOUNTER — Inpatient Hospital Stay (HOSPITAL_COMMUNITY)
Admission: AD | Admit: 2021-03-30 | Discharge: 2021-03-30 | Disposition: A | Discharge status: Home / Self Care | Payer: BC Managed Care – PPO | Attending: Obstetrics and Gynecology | Admitting: Obstetrics and Gynecology

## 2021-03-30 DIAGNOSIS — Z3A36 36 weeks gestation of pregnancy: Secondary | ICD-10-CM | POA: Diagnosis not present

## 2021-03-30 DIAGNOSIS — Z98891 History of uterine scar from previous surgery: Secondary | ICD-10-CM | POA: Insufficient documentation

## 2021-03-30 DIAGNOSIS — O4103X0 Oligohydramnios, third trimester, not applicable or unspecified: Secondary | ICD-10-CM | POA: Diagnosis not present

## 2021-03-30 DIAGNOSIS — O34219 Maternal care for unspecified type scar from previous cesarean delivery: Secondary | ICD-10-CM | POA: Diagnosis not present

## 2021-03-30 DIAGNOSIS — Z3483 Encounter for supervision of other normal pregnancy, third trimester: Secondary | ICD-10-CM | POA: Diagnosis not present

## 2021-03-30 DIAGNOSIS — O368990 Maternal care for other specified fetal problems, unspecified trimester, not applicable or unspecified: Secondary | ICD-10-CM | POA: Diagnosis not present

## 2021-03-30 DIAGNOSIS — O4103X1 Oligohydramnios, third trimester, fetus 1: Secondary | ICD-10-CM

## 2021-03-30 DIAGNOSIS — O99820 Streptococcus B carrier state complicating pregnancy: Secondary | ICD-10-CM | POA: Diagnosis not present

## 2021-03-30 DIAGNOSIS — Z91030 Bee allergy status: Secondary | ICD-10-CM | POA: Diagnosis not present

## 2021-03-30 HISTORY — DX: Anemia, unspecified: D64.9

## 2021-03-30 NOTE — MAU Provider Note (Signed)
History     Chief Complaint  Patient presents with   6/8 on BPP, needs NST     25 yo G2P1 MBF previous C/S @  36 1/[redacted] weeks gestation sent from the office for NST due to [facility] 6/8 on office  Sonogram. Pt has had AFI of 5.6 cm and was being followed OB History     Gravida  2   Para  1   Term  1   Preterm      AB      Living  1      SAB      IAB      Ectopic      Multiple      Live Births  1           Past Medical History:  Diagnosis Date   Allergic rhinitis 03/22/2014   Anemia    Atopic dermatitis 05/18/2014   H/O allergic urticaria 12/02/2014   Headache    migraines   Nexplanon in place - jan 2019 12/11/2018   Supraumbilical hernia     Past Surgical History:  Procedure Laterality Date   CESAREAN SECTION N/A 08/26/2017   Procedure: CESAREAN SECTION;  Surgeon: Maxie Better, MD;  Location: [facility];  Service: Obstetrics;  Laterality: N/A;   UMBILICAL HERNIA REPAIR N/A 09/14/2019   Procedure: OPEN REPAIR INCARCERATED SUPRAUMBILICAL HERNIA;  Surgeon: Berna Bue, MD;  Location: [facility];  Service: General;  Laterality: N/A;    Family History  Problem Relation Age of Onset   Healthy Mother    Healthy Father    Dementia Maternal Grandmother    Heart attack Paternal Grandmother    Asthma Neg Hx    Diabetes Neg Hx    Heart disease Neg Hx    Hyperlipidemia Neg Hx    Hypertension Neg Hx     Social History   Tobacco Use   Smoking status: Never   Smokeless tobacco: Never   Tobacco comments:    Pt states no current smokers in household  Vaping Use   Vaping Use: Never used  Substance Use Topics   Alcohol use: Not Currently   Drug use: No    Allergies:  Allergies  Allergen Reactions   Bee Venom Anaphylaxis    Medications Prior to Admission  Medication Sig Dispense Refill Last Dose   magnesium gluconate (MAGONATE) 500 MG tablet Take 500 mg by mouth 2 (two) times daily.   03/30/2021   Prenatal Vit-Fe  Fumarate-FA (PRENATAL VITAMIN PO) Take by mouth.   03/30/2021     Physical Exam   Blood pressure (!) 103/51, pulse 91, temperature 98.1 F (36.7 C), temperature source Oral, resp. rate 16, last menstrual period 07/10/2020, SpO2 98 %.  No exam performed today,  done in office .  Tracing: baseline 140 (+) accel to 170  No ctx ED Course  IMP: BPP 6/8  Borderline low amniotic fluid Previous C/S IUP @ 36 1/7 wk P) d/c home . F/u office in one week. Daily kick ct MDM    Serita Kyle, MD 7:42 PM 03/30/2021

## 2021-03-30 NOTE — MAU Note (Addendum)
Sent over from office, BPP was 6/8.  Needs NST.  Borderline fluid, pt reports decrease in FM.

## 2021-04-05 DIAGNOSIS — O4100X0 Oligohydramnios, unspecified trimester, not applicable or unspecified: Secondary | ICD-10-CM | POA: Diagnosis not present

## 2021-04-05 DIAGNOSIS — O34219 Maternal care for unspecified type scar from previous cesarean delivery: Secondary | ICD-10-CM | POA: Diagnosis not present

## 2021-04-05 DIAGNOSIS — Z3483 Encounter for supervision of other normal pregnancy, third trimester: Secondary | ICD-10-CM | POA: Diagnosis not present

## 2021-04-05 DIAGNOSIS — O99820 Streptococcus B carrier state complicating pregnancy: Secondary | ICD-10-CM | POA: Diagnosis not present

## 2021-04-12 NOTE — Patient Instructions (Signed)
Claire Schmidt  04/12/2021   Your procedure is scheduled on:  04/22/2021  Arrive at 0645 at Entrance C on [address] at [facility]  and [employer]. You are invited to use the FREE valet parking or use the Visitor's parking deck.  Pick up the phone at the desk and dial [phone].  Call this number if you have problems the morning of surgery: [phone]  Remember:   Do not eat food:(After Midnight) Desps de medianoche.  Do not drink clear liquids: (After Midnight) Desps de medianoche.  Take these medicines the morning of surgery with A SIP OF WATER:  none   Do not wear jewelry, make-up or nail polish.  Do not wear lotions, powders, or perfumes. Do not wear deodorant.  Do not shave 48 hours prior to surgery.  Do not bring valuables to the hospital.  [facility] is not   responsible for any belongings or valuables brought to the hospital.  Contacts, dentures or bridgework may not be worn into surgery.  Leave suitcase in the car. After surgery it may be brought to your room.  For patients admitted to the hospital, checkout time is 11:00 AM the day of              discharge.      Please read over the following fact sheets that you were given:     Preparing for Surgery

## 2021-04-13 DIAGNOSIS — O34219 Maternal care for unspecified type scar from previous cesarean delivery: Secondary | ICD-10-CM | POA: Diagnosis not present

## 2021-04-13 DIAGNOSIS — O4100X0 Oligohydramnios, unspecified trimester, not applicable or unspecified: Secondary | ICD-10-CM | POA: Diagnosis not present

## 2021-04-13 DIAGNOSIS — Z3483 Encounter for supervision of other normal pregnancy, third trimester: Secondary | ICD-10-CM | POA: Diagnosis not present

## 2021-04-13 DIAGNOSIS — O99820 Streptococcus B carrier state complicating pregnancy: Secondary | ICD-10-CM | POA: Diagnosis not present

## 2021-04-14 ENCOUNTER — Encounter (HOSPITAL_COMMUNITY)
Admission: AD | Disposition: A | Discharge status: Home / Self Care | Payer: Self-pay | Source: Home / Self Care | Attending: Obstetrics and Gynecology

## 2021-04-14 ENCOUNTER — Inpatient Hospital Stay (HOSPITAL_COMMUNITY): Payer: BC Managed Care – PPO | Admitting: Anesthesiology

## 2021-04-14 ENCOUNTER — Inpatient Hospital Stay (HOSPITAL_COMMUNITY)
Admission: AD | Admit: 2021-04-14 | Discharge: 2021-04-16 | DRG: 788 | Disposition: A | Discharge status: Home / Self Care | Payer: BC Managed Care – PPO | Attending: Obstetrics and Gynecology | Admitting: Obstetrics and Gynecology

## 2021-04-14 ENCOUNTER — Other Ambulatory Visit: Payer: Self-pay

## 2021-04-14 ENCOUNTER — Encounter (HOSPITAL_COMMUNITY): Payer: Self-pay | Admitting: Obstetrics and Gynecology

## 2021-04-14 DIAGNOSIS — O26893 Other specified pregnancy related conditions, third trimester: Secondary | ICD-10-CM | POA: Diagnosis not present

## 2021-04-14 DIAGNOSIS — O368390 Maternal care for abnormalities of the fetal heart rate or rhythm, unspecified trimester, not applicable or unspecified: Secondary | ICD-10-CM | POA: Diagnosis not present

## 2021-04-14 DIAGNOSIS — Z3A00 Weeks of gestation of pregnancy not specified: Secondary | ICD-10-CM

## 2021-04-14 DIAGNOSIS — Z3A38 38 weeks gestation of pregnancy: Secondary | ICD-10-CM

## 2021-04-14 DIAGNOSIS — O34219 Maternal care for unspecified type scar from previous cesarean delivery: Secondary | ICD-10-CM | POA: Diagnosis not present

## 2021-04-14 DIAGNOSIS — O328XX0 Maternal care for other malpresentation of fetus, not applicable or unspecified: Secondary | ICD-10-CM | POA: Diagnosis not present

## 2021-04-14 DIAGNOSIS — O34211 Maternal care for low transverse scar from previous cesarean delivery: Secondary | ICD-10-CM | POA: Diagnosis not present

## 2021-04-14 DIAGNOSIS — O43813 Placental infarction, third trimester: Secondary | ICD-10-CM | POA: Diagnosis not present

## 2021-04-14 DIAGNOSIS — O99824 Streptococcus B carrier state complicating childbirth: Secondary | ICD-10-CM | POA: Diagnosis not present

## 2021-04-14 DIAGNOSIS — B951 Streptococcus, group B, as the cause of diseases classified elsewhere: Secondary | ICD-10-CM | POA: Diagnosis not present

## 2021-04-14 DIAGNOSIS — Z20822 Contact with and (suspected) exposure to covid-19: Secondary | ICD-10-CM | POA: Diagnosis not present

## 2021-04-14 LAB — CBC
HCT: 33.8 % — ABNORMAL LOW (ref 36.0–46.0)
Hemoglobin: 11 g/dL — ABNORMAL LOW (ref 12.0–15.0)
MCH: 27.2 pg (ref 26.0–34.0)
MCHC: 32.5 g/dL (ref 30.0–36.0)
MCV: 83.7 fL (ref 80.0–100.0)
Platelets: 319 10*3/uL (ref 150–400)
RBC: 4.04 MIL/uL (ref 3.87–5.11)
RDW: 13.5 % (ref 11.5–15.5)
WBC: 12 10*3/uL — ABNORMAL HIGH (ref 4.0–10.5)
nRBC: 0 % (ref 0.0–0.2)

## 2021-04-14 LAB — TYPE AND SCREEN
ABO/RH(D): B POS
Antibody Screen: NEGATIVE

## 2021-04-14 LAB — RESP PANEL BY RT-PCR (FLU A&B, COVID) ARPGX2
Influenza A by PCR: NEGATIVE
Influenza B by PCR: NEGATIVE
SARS Coronavirus 2 by RT PCR: NEGATIVE

## 2021-04-14 LAB — RPR: RPR Ser Ql: NONREACTIVE

## 2021-04-14 LAB — POCT FERN TEST: POCT Fern Test: POSITIVE

## 2021-04-14 SURGERY — Surgical Case
Anesthesia: Epidural

## 2021-04-14 MED ORDER — DIBUCAINE (PERIANAL) 1 % EX OINT: 1.0000 "application " | TOPICAL_OINTMENT | CUTANEOUS | Status: DC | PRN

## 2021-04-14 MED ORDER — PHENYLEPHRINE HCL-NACL 20-0.9 MG/250ML-% IV SOLN
INTRAVENOUS | Status: DC | PRN
  Administered 2021-04-14: 80 ug/min via INTRAVENOUS

## 2021-04-14 MED ORDER — PRENATAL MULTIVITAMIN CH
1.0000 | ORAL_TABLET | Freq: Every day | ORAL | Status: DC
  Administered 2021-04-15 – 2021-04-16 (×2): 1 via ORAL
  Filled 2021-04-14 (×2): qty 1

## 2021-04-14 MED ORDER — FLEET ENEMA 7-19 GM/118ML RE ENEM: 1.0000 | ENEMA | RECTAL | Status: DC | PRN

## 2021-04-14 MED ORDER — DIPHENHYDRAMINE HCL 25 MG PO CAPS: 25.0000 mg | ORAL_CAPSULE | ORAL | Status: DC | PRN

## 2021-04-14 MED ORDER — LIDOCAINE HCL (PF) 1 % IJ SOLN: 30.0000 mL | INTRAMUSCULAR | Status: DC | PRN

## 2021-04-14 MED ORDER — FENTANYL CITRATE (PF) 100 MCG/2ML IJ SOLN
INTRAMUSCULAR | Status: AC
  Filled 2021-04-14: qty 2

## 2021-04-14 MED ORDER — SODIUM CHLORIDE 0.9% FLUSH: 3.0000 mL | INTRAVENOUS | Status: DC | PRN

## 2021-04-14 MED ORDER — OXYCODONE-ACETAMINOPHEN 5-325 MG PO TABS: 1.0000 | ORAL_TABLET | ORAL | Status: DC | PRN

## 2021-04-14 MED ORDER — TERBUTALINE SULFATE 1 MG/ML IJ SOLN: 0.2500 mg | Freq: Once | INTRAMUSCULAR | Status: DC | PRN

## 2021-04-14 MED ORDER — PENICILLIN G POT IN DEXTROSE 60000 UNIT/ML IV SOLN
3.0000 10*6.[IU] | INTRAVENOUS | Status: DC
  Administered 2021-04-14 (×3): 3 10*6.[IU] via INTRAVENOUS
  Filled 2021-04-14 (×4): qty 50

## 2021-04-14 MED ORDER — METOCLOPRAMIDE HCL 5 MG/ML IJ SOLN
INTRAMUSCULAR | Status: DC | PRN
  Administered 2021-04-14: 10 mg via INTRAVENOUS

## 2021-04-14 MED ORDER — LACTATED RINGERS IV SOLN
500.0000 mL | INTRAVENOUS | Status: DC | PRN
  Administered 2021-04-14: 500 mL via INTRAVENOUS

## 2021-04-14 MED ORDER — PHENYLEPHRINE HCL (PRESSORS) 10 MG/ML IV SOLN
INTRAVENOUS | Status: DC | PRN
  Administered 2021-04-14: 160 ug via INTRAVENOUS
  Administered 2021-04-14 (×3): 80 ug via INTRAVENOUS

## 2021-04-14 MED ORDER — DEXAMETHASONE SODIUM PHOSPHATE 4 MG/ML IJ SOLN
INTRAMUSCULAR | Status: DC | PRN
  Administered 2021-04-14: 8 mg via INTRAVENOUS

## 2021-04-14 MED ORDER — ACETAMINOPHEN 10 MG/ML IV SOLN
INTRAVENOUS | Status: DC | PRN
  Administered 2021-04-14: 1000 mg via INTRAVENOUS

## 2021-04-14 MED ORDER — OXYCODONE-ACETAMINOPHEN 5-325 MG PO TABS
2.0000 | ORAL_TABLET | ORAL | Status: DC | PRN
Start: 2021-04-14 — End: 2021-04-14

## 2021-04-14 MED ORDER — FENTANYL CITRATE (PF) 100 MCG/2ML IJ SOLN
25.0000 ug | Freq: Once | INTRAMUSCULAR | Status: AC
  Administered 2021-04-14: 25 ug via INTRAVENOUS

## 2021-04-14 MED ORDER — EPHEDRINE 5 MG/ML INJ: 10.0000 mg | INTRAVENOUS | Status: DC | PRN

## 2021-04-14 MED ORDER — DEXTROSE 5 % IV SOLN
INTRAVENOUS | Status: DC | PRN
  Administered 2021-04-14: 3 g via INTRAVENOUS

## 2021-04-14 MED ORDER — ONDANSETRON HCL 4 MG/2ML IJ SOLN
INTRAMUSCULAR | Status: AC
  Filled 2021-04-14: qty 2

## 2021-04-14 MED ORDER — CHLOROPROCAINE HCL (PF) 3 % IJ SOLN
INTRAMUSCULAR | Status: DC | PRN
  Administered 2021-04-14 (×2): 5 mL via EPIDURAL
  Administered 2021-04-14: 10 mL via EPIDURAL

## 2021-04-14 MED ORDER — METOCLOPRAMIDE HCL 5 MG/ML IJ SOLN
INTRAMUSCULAR | Status: AC
  Filled 2021-04-14: qty 2

## 2021-04-14 MED ORDER — NALOXONE HCL 0.4 MG/ML IJ SOLN: 0.4000 mg | INTRAMUSCULAR | Status: DC | PRN

## 2021-04-14 MED ORDER — OXYTOCIN-SODIUM CHLORIDE 30-0.9 UT/500ML-% IV SOLN
1.0000 m[IU]/min | INTRAVENOUS | Status: DC
Start: 2021-04-14 — End: 2021-04-14
  Administered 2021-04-14: 2 m[IU]/min via INTRAVENOUS

## 2021-04-14 MED ORDER — SOD CITRATE-CITRIC ACID 500-334 MG/5ML PO SOLN
30.0000 mL | ORAL | Status: DC | PRN
  Administered 2021-04-14: 30 mL via ORAL
  Filled 2021-04-14 (×2): qty 30

## 2021-04-14 MED ORDER — SCOPOLAMINE 1 MG/3DAYS TD PT72: 1.0000 | MEDICATED_PATCH | Freq: Once | TRANSDERMAL | Status: DC

## 2021-04-14 MED ORDER — ZOLPIDEM TARTRATE 5 MG PO TABS: 5.0000 mg | ORAL_TABLET | Freq: Every evening | ORAL | Status: DC | PRN

## 2021-04-14 MED ORDER — ACETAMINOPHEN 500 MG PO TABS
1000.0000 mg | ORAL_TABLET | Freq: Four times a day (QID) | ORAL | Status: AC
  Administered 2021-04-15 (×4): 1000 mg via ORAL
  Filled 2021-04-14 (×4): qty 2

## 2021-04-14 MED ORDER — NALBUPHINE HCL 10 MG/ML IJ SOLN
5.0000 mg | Freq: Once | INTRAMUSCULAR | Status: DC | PRN
Start: 2021-04-14 — End: 2021-04-16

## 2021-04-14 MED ORDER — MORPHINE SULFATE (PF) 0.5 MG/ML IJ SOLN
INTRAMUSCULAR | Status: DC | PRN
  Administered 2021-04-14: 3 mg via EPIDURAL

## 2021-04-14 MED ORDER — NALBUPHINE HCL 10 MG/ML IJ SOLN: 5.0000 mg | INTRAMUSCULAR | Status: DC | PRN

## 2021-04-14 MED ORDER — FENTANYL-BUPIVACAINE-NACL 0.5-0.125-0.9 MG/250ML-% EP SOLN
12.0000 mL/h | EPIDURAL | Status: DC | PRN
  Filled 2021-04-14: qty 250

## 2021-04-14 MED ORDER — SIMETHICONE 80 MG PO CHEW: 80.0000 mg | CHEWABLE_TABLET | ORAL | Status: DC | PRN

## 2021-04-14 MED ORDER — KETOROLAC TROMETHAMINE 30 MG/ML IJ SOLN
INTRAMUSCULAR | Status: AC
  Filled 2021-04-14: qty 1

## 2021-04-14 MED ORDER — DIPHENHYDRAMINE HCL 25 MG PO CAPS: 25.0000 mg | ORAL_CAPSULE | Freq: Four times a day (QID) | ORAL | Status: DC | PRN

## 2021-04-14 MED ORDER — PHENYLEPHRINE 40 MCG/ML (10ML) SYRINGE FOR IV PUSH (FOR BLOOD PRESSURE SUPPORT): 80.0000 ug | PREFILLED_SYRINGE | INTRAVENOUS | Status: DC | PRN

## 2021-04-14 MED ORDER — SENNOSIDES-DOCUSATE SODIUM 8.6-50 MG PO TABS
2.0000 | ORAL_TABLET | ORAL | Status: DC
  Administered 2021-04-15 – 2021-04-16 (×2): 2 via ORAL
  Filled 2021-04-14 (×2): qty 2

## 2021-04-14 MED ORDER — PHENYLEPHRINE 40 MCG/ML (10ML) SYRINGE FOR IV PUSH (FOR BLOOD PRESSURE SUPPORT)
PREFILLED_SYRINGE | INTRAVENOUS | Status: AC
  Filled 2021-04-14: qty 10

## 2021-04-14 MED ORDER — ONDANSETRON HCL 4 MG/2ML IJ SOLN: 4.0000 mg | Freq: Three times a day (TID) | INTRAMUSCULAR | Status: DC | PRN

## 2021-04-14 MED ORDER — DEXAMETHASONE SODIUM PHOSPHATE 4 MG/ML IJ SOLN
INTRAMUSCULAR | Status: AC
  Filled 2021-04-14: qty 1

## 2021-04-14 MED ORDER — OXYTOCIN-SODIUM CHLORIDE 30-0.9 UT/500ML-% IV SOLN
2.5000 [IU]/h | INTRAVENOUS | Status: DC
  Filled 2021-04-14: qty 500

## 2021-04-14 MED ORDER — LACTATED RINGERS IV SOLN
500.0000 mL | Freq: Once | INTRAVENOUS | Status: AC
  Administered 2021-04-14: 500 mL via INTRAVENOUS

## 2021-04-14 MED ORDER — ONDANSETRON HCL 4 MG/2ML IJ SOLN: 4.0000 mg | Freq: Four times a day (QID) | INTRAMUSCULAR | Status: DC | PRN

## 2021-04-14 MED ORDER — LIDOCAINE HCL (PF) 1 % IJ SOLN
INTRAMUSCULAR | Status: DC | PRN
  Administered 2021-04-14: 5 mL via EPIDURAL

## 2021-04-14 MED ORDER — DIPHENHYDRAMINE HCL 50 MG/ML IJ SOLN: 12.5000 mg | INTRAMUSCULAR | Status: DC | PRN

## 2021-04-14 MED ORDER — LACTATED RINGERS IV SOLN: INTRAVENOUS | Status: DC | PRN

## 2021-04-14 MED ORDER — MENTHOL 3 MG MT LOZG: 1.0000 | LOZENGE | OROMUCOSAL | Status: DC | PRN

## 2021-04-14 MED ORDER — MORPHINE SULFATE (PF) 0.5 MG/ML IJ SOLN
INTRAMUSCULAR | Status: AC
  Filled 2021-04-14: qty 10

## 2021-04-14 MED ORDER — ONDANSETRON HCL 4 MG/2ML IJ SOLN
INTRAMUSCULAR | Status: DC | PRN
  Administered 2021-04-14: 4 mg via INTRAVENOUS

## 2021-04-14 MED ORDER — KETOROLAC TROMETHAMINE 30 MG/ML IJ SOLN: 30.0000 mg | Freq: Four times a day (QID) | INTRAMUSCULAR | Status: DC | PRN

## 2021-04-14 MED ORDER — OXYCODONE HCL 5 MG PO TABS
5.0000 mg | ORAL_TABLET | ORAL | Status: DC | PRN
  Administered 2021-04-15 (×3): 5 mg via ORAL
  Administered 2021-04-15 – 2021-04-16 (×3): 10 mg via ORAL
  Filled 2021-04-14: qty 2
  Filled 2021-04-14: qty 1
  Filled 2021-04-14: qty 2
  Filled 2021-04-14: qty 1
  Filled 2021-04-14: qty 2
  Filled 2021-04-14: qty 1

## 2021-04-14 MED ORDER — FENTANYL CITRATE (PF) 100 MCG/2ML IJ SOLN
INTRAMUSCULAR | Status: DC | PRN
  Administered 2021-04-14: 50 ug via INTRAVENOUS

## 2021-04-14 MED ORDER — KETOROLAC TROMETHAMINE 30 MG/ML IJ SOLN
30.0000 mg | Freq: Four times a day (QID) | INTRAMUSCULAR | Status: DC | PRN
  Administered 2021-04-14: 30 mg via INTRAVENOUS

## 2021-04-14 MED ORDER — OXYTOCIN BOLUS FROM INFUSION: 333.0000 mL | Freq: Once | INTRAVENOUS | Status: DC

## 2021-04-14 MED ORDER — SODIUM CHLORIDE 0.9 % IV SOLN
5.0000 10*6.[IU] | Freq: Once | INTRAVENOUS | Status: AC
  Administered 2021-04-14: 5 10*6.[IU] via INTRAVENOUS
  Filled 2021-04-14: qty 5

## 2021-04-14 MED ORDER — OXYTOCIN-SODIUM CHLORIDE 30-0.9 UT/500ML-% IV SOLN
2.5000 [IU]/h | INTRAVENOUS | Status: AC
  Administered 2021-04-14: 2.5 [IU]/h via INTRAVENOUS

## 2021-04-14 MED ORDER — IBUPROFEN 600 MG PO TABS: 600.0000 mg | ORAL_TABLET | Freq: Four times a day (QID) | ORAL | Status: DC

## 2021-04-14 MED ORDER — FENTANYL CITRATE (PF) 100 MCG/2ML IJ SOLN
100.0000 ug | INTRAMUSCULAR | Status: DC | PRN
  Administered 2021-04-14: 100 ug via INTRAVENOUS

## 2021-04-14 MED ORDER — OXYTOCIN-SODIUM CHLORIDE 30-0.9 UT/500ML-% IV SOLN
INTRAVENOUS | Status: AC
  Filled 2021-04-14: qty 500

## 2021-04-14 MED ORDER — WITCH HAZEL-GLYCERIN EX PADS: 1.0000 "application " | MEDICATED_PAD | CUTANEOUS | Status: DC | PRN

## 2021-04-14 MED ORDER — LACTATED RINGERS IV SOLN: INTRAVENOUS | Status: DC

## 2021-04-14 MED ORDER — ACETAMINOPHEN 325 MG PO TABS: 650.0000 mg | ORAL_TABLET | ORAL | Status: DC | PRN

## 2021-04-14 MED ORDER — KETOROLAC TROMETHAMINE 30 MG/ML IJ SOLN
30.0000 mg | Freq: Four times a day (QID) | INTRAMUSCULAR | Status: AC
  Administered 2021-04-15: 30 mg via INTRAVENOUS
  Filled 2021-04-14: qty 1

## 2021-04-14 MED ORDER — SIMETHICONE 80 MG PO CHEW
80.0000 mg | CHEWABLE_TABLET | Freq: Three times a day (TID) | ORAL | Status: DC
  Administered 2021-04-15 – 2021-04-16 (×4): 80 mg via ORAL
  Filled 2021-04-14 (×4): qty 1

## 2021-04-14 MED ORDER — OXYTOCIN-SODIUM CHLORIDE 30-0.9 UT/500ML-% IV SOLN
INTRAVENOUS | Status: DC | PRN
  Administered 2021-04-14: 300 mL via INTRAVENOUS

## 2021-04-14 MED ORDER — NALOXONE HCL 4 MG/10ML IJ SOLN
1.0000 ug/kg/h | INTRAVENOUS | Status: DC | PRN
  Filled 2021-04-14: qty 5

## 2021-04-14 MED ORDER — FENTANYL-BUPIVACAINE-NACL 0.5-0.125-0.9 MG/250ML-% EP SOLN
EPIDURAL | Status: DC | PRN
  Administered 2021-04-14: 12 mL/h via EPIDURAL

## 2021-04-14 MED ORDER — COCONUT OIL OIL: 1.0000 "application " | TOPICAL_OIL | Status: DC | PRN

## 2021-04-14 SURGICAL SUPPLY — 44 items
APL SKNCLS STERI-STRIP NONHPOA (GAUZE/BANDAGES/DRESSINGS) ×1
BARRIER ADHS 3X4 INTERCEED (GAUZE/BANDAGES/DRESSINGS) ×2 IMPLANT
BENZOIN TINCTURE PRP APPL 2/3 (GAUZE/BANDAGES/DRESSINGS) ×1 IMPLANT
BRR ADH 4X3 ABS CNTRL BYND (GAUZE/BANDAGES/DRESSINGS) ×1
CHLORAPREP W/TINT 26ML (MISCELLANEOUS) ×2 IMPLANT
CLAMP CORD UMBIL (MISCELLANEOUS) IMPLANT
CLOTH BEACON ORANGE TIMEOUT ST (SAFETY) ×2 IMPLANT
DRAPE C SECTION CLR SCREEN (DRAPES) ×2 IMPLANT
DRSG OPSITE POSTOP 4X10 (GAUZE/BANDAGES/DRESSINGS) ×2 IMPLANT
ELECT REM PT RETURN 9FT ADLT (ELECTROSURGICAL) ×2
ELECTRODE REM PT RTRN 9FT ADLT (ELECTROSURGICAL) ×1 IMPLANT
EXTRACTOR VACUUM M CUP 4 TUBE (SUCTIONS) IMPLANT
GLOVE BIOGEL PI IND STRL 7.0 (GLOVE) ×2 IMPLANT
GLOVE BIOGEL PI INDICATOR 7.0 (GLOVE) ×2
GLOVE ECLIPSE 6.5 STRL STRAW (GLOVE) ×2 IMPLANT
GOWN STRL REUS W/TWL LRG LVL3 (GOWN DISPOSABLE) ×4 IMPLANT
KIT ABG SYR 3ML LUER SLIP (SYRINGE) IMPLANT
NDL HYPO 25X5/8 SAFETYGLIDE (NEEDLE) IMPLANT
NEEDLE HYPO 22GX1.5 SAFETY (NEEDLE) ×2 IMPLANT
NEEDLE HYPO 25X5/8 SAFETYGLIDE (NEEDLE) IMPLANT
NS IRRIG 1000ML POUR BTL (IV SOLUTION) ×2 IMPLANT
PACK C SECTION WH (CUSTOM PROCEDURE TRAY) ×2 IMPLANT
PAD OB MATERNITY 4.3X12.25 (PERSONAL CARE ITEMS) ×2 IMPLANT
RTRCTR C-SECT PINK 25CM LRG (MISCELLANEOUS) IMPLANT
STRIP CLOSURE SKIN 1/2X4 (GAUZE/BANDAGES/DRESSINGS) ×1 IMPLANT
SUT CHROMIC GUT AB #0 18 (SUTURE) IMPLANT
SUT MNCRL 0 VIOLET CTX 36 (SUTURE) ×3 IMPLANT
SUT MON AB 2-0 SH 27 (SUTURE)
SUT MON AB 2-0 SH27 (SUTURE) IMPLANT
SUT MON AB 3-0 SH 27 (SUTURE)
SUT MON AB 3-0 SH27 (SUTURE) IMPLANT
SUT MON AB 4-0 PS1 27 (SUTURE) IMPLANT
SUT MONOCRYL 0 CTX 36 (SUTURE) ×6
SUT PLAIN 2 0 (SUTURE)
SUT PLAIN 2 0 XLH (SUTURE) IMPLANT
SUT PLAIN ABS 2-0 CT1 27XMFL (SUTURE) IMPLANT
SUT VIC AB 0 CT1 36 (SUTURE) ×4 IMPLANT
SUT VIC AB 2-0 CT1 27 (SUTURE) ×4
SUT VIC AB 2-0 CT1 TAPERPNT 27 (SUTURE) ×1 IMPLANT
SUT VIC AB 4-0 PS2 27 (SUTURE) ×1 IMPLANT
SYR CONTROL 10ML LL (SYRINGE) ×2 IMPLANT
TOWEL OR 17X24 6PK STRL BLUE (TOWEL DISPOSABLE) ×2 IMPLANT
TRAY FOLEY W/BAG SLVR 14FR LF (SET/KITS/TRAYS/PACK) IMPLANT
WATER STERILE IRR 1000ML POUR (IV SOLUTION) ×2 IMPLANT

## 2021-04-14 NOTE — Progress Notes (Signed)
Note FHR deceleration x 4 mins then return to baseline 140 (+) accel to 160. On arrival in the room , pt asleep and lying on the left  VE 4/80/0 station IUPC/ISE placed  IMP: Labor VBAC GBS cx (+) on IV PCN P) recommended epidural . Pt agrees. Cont Pitocin

## 2021-04-14 NOTE — Transfer of Care (Signed)
Immediate Anesthesia Transfer of Care Note  Patient: Claire Schmidt  Procedure(s) Performed: CESAREAN SECTION  Patient Location: PACU  Anesthesia Type:Epidural  Level of Consciousness: awake, alert  and oriented  Airway & Oxygen Therapy: Patient Spontanous Breathing  Post-op Assessment: Report given to RN and Post -op Vital signs reviewed and stable  Post vital signs: Reviewed and stable  Last Vitals:  Vitals Value Taken Time  BP 134/73 04/14/21 1910  Temp    Pulse 82 04/14/21 1915  Resp 15 04/14/21 1915  SpO2 95 % 04/14/21 1915  Vitals shown include unvalidated device data.  Last Pain:  Vitals:   04/14/21 1631  TempSrc:   PainSc: 0-No pain         Complications: No notable events documented.

## 2021-04-14 NOTE — Progress Notes (Signed)
Late entry Pt began pushing.  Deep variables noted VE fully +1/+2 station Positional changes done, scalp stim Continued pushing with the  Anticipation of being able to perform Vacuum assisted delivery Pitocin discontinued . OR notified to Open  For possible C/S Manual rotation of vtx not successful,  Continued deceleration resulted in the decision To proceed with stat C/S. Verbal consent from pt To OR

## 2021-04-14 NOTE — Progress Notes (Signed)
S: notes some vaginal pressure  O: BP (!) 104/52   Pulse (!) 118   Temp 98 F (36.7 C) (Oral)   Resp 14   Ht 5' (1.524 m)   Wt 110.2 kg   LMP 07/10/2020   SpO2 100%   BMI 47.46 kg/m  VE fully +1/ +2  Tracing: baseline 135 (+) accels 145 Ctx q  IMP: complete VBAC Previous LTCS P) labor vtx

## 2021-04-14 NOTE — MAU Note (Signed)
Pt reports ROM at 0030, denies contractions, reports good fetal movement

## 2021-04-14 NOTE — H&P (Signed)
Claire Schmidt is Claire 25 y.o. female presenting @ 1 2/[redacted] wk gestation with SROM @ 12: 00 am. Occ ctx (+) FM. Previous LTCS due to arrest of dilation. OB History     Gravida  2   Para  1   Term  1   Preterm      AB      Living  1      SAB      IAB      Ectopic      Multiple      Live Births  1          Past Medical History:  Diagnosis Date   Allergic rhinitis 03/22/2014   Anemia    Atopic dermatitis 05/18/2014   H/O allergic urticaria 12/02/2014   Headache    migraines   Nexplanon in place - jan 2019 12/11/2018   Supraumbilical hernia    Past Surgical History:  Procedure Laterality Date   CESAREAN SECTION N/Claire 08/26/2017   Procedure: CESAREAN SECTION;  Surgeon: Maxie Better, MD;  Location: [facility]IRTHING SUIT[facility];  Service: Obstetrics;  Laterality: N/Claire;   UMBILICAL HERNIA REPAIR N/Claire 09/14/2019   Procedure: OPEN REPAIR INCARCERATED SUPRAUMBILICAL HERNIA;  Surgeon: Berna Bue, MD;  Location: [facility];  Service: General;  Laterality: N/Claire;   Family History: family history includes Dementia in her maternal grandmother; Healthy in her father and mother; Heart attack in her paternal grandmother. Social History:  reports that she has never smoked. She has never used smokeless tobacco. She reports previous alcohol use. She reports that she does not use drugs.     Maternal Diabetes: No Genetic Screening: Declined Maternal Ultrasounds/Referrals: Other: Fetal Ultrasounds or other Referrals:  Referred to Materal Fetal Medicine  due to umb cord cyst Maternal Substance Abuse:  No Significant Maternal Medications:  None Significant Maternal Lab Results:  Group B Strep positive Other Comments:   previous Cesarean section , borderline low AFI  Review of Systems History   Blood pressure 131/75, pulse 86, temperature 98.6 F (37 C), temperature source Oral, resp. rate 17, last menstrual period 07/10/2020, SpO2 99 %. Maternal Exam:  Introitus:  Normal vulva.  Physical Exam Constitutional:      Appearance: She is obese.  Eyes:     Extraocular Movements: Extraocular movements intact.  Cardiovascular:     Rate and Rhythm: Regular rhythm.     Heart sounds: Normal heart sounds.  Pulmonary:     Breath sounds: Normal breath sounds.  Genitourinary:    General: Normal vulva.  Musculoskeletal:        General: Normal range of motion.     Cervical back: Neck supple.  Skin:    General: Skin is warm and dry.  Neurological:     General: No focal deficit present.     Mental Status: She is alert and oriented to person, place, and time.  Psychiatric:        Mood and Affect: Mood normal.        Behavior: Behavior normal.    Prenatal labs: ABO, Rh: --/--/B POS (07/08 0151) Antibody: NEG (07/08 0151) Rubella:  immune RPR:   NR HBsAg:   neg HIV:   neg GBS:   positive  Assessment/Plan: SROM  Term gestation GBS cx (+) Previous LTCS desires VBAC P) admit routine labs. IV PCN. Pitocin augmentation. Analgesic/epidural   Claire Schmidt Claire Schmidt 04/14/2021, 3:45 AM

## 2021-04-14 NOTE — Anesthesia Preprocedure Evaluation (Signed)
Anesthesia Evaluation  Patient identified by MRN, date of birth, ID band Patient awake    Reviewed: Allergy & Precautions, NPO status , Patient's Chart, lab work & pertinent test results  Airway Mallampati: III  TM Distance: >3 FB Neck ROM: Full    Dental no notable dental hx. (+) Teeth Intact, Dental Advisory Given   Pulmonary neg pulmonary ROS,    Pulmonary exam normal breath sounds clear to auscultation       Cardiovascular negative cardio ROS Normal cardiovascular exam Rhythm:Regular Rate:Normal     Neuro/Psych  Headaches, negative psych ROS   GI/Hepatic negative GI ROS, Neg liver ROS,   Endo/Other  negative endocrine ROS  Renal/GU negative Renal ROS     Musculoskeletal negative musculoskeletal ROS (+)   Abdominal (+) + obese,   Peds  Hematology Lab Results      Component                Value               Date                      WBC                      12.0 (H)            04/14/2021                HGB                      11.0 (L)            04/14/2021                HCT                      33.8 (L)            04/14/2021                MCV                      83.7                04/14/2021                PLT                      319                 04/14/2021              Anesthesia Other Findings   Reproductive/Obstetrics (+) Pregnancy                            Anesthesia Physical Anesthesia Plan  ASA: 3  Anesthesia Plan: Epidural   Post-op Pain Management:    Induction:   PONV Risk Score and Plan:   Airway Management Planned:   Additional Equipment:   Intra-op Plan:   Post-operative Plan:   Informed Consent: I have reviewed the patients History and Physical, chart, labs and discussed the procedure including the risks, benefits and alternatives for the proposed anesthesia with the patient or authorized representative who has indicated his/her understanding and  acceptance.       Plan Discussed with:   Anesthesia Plan Comments: (38.2 Wk G2P1 for  TOLAC for LE)       Anesthesia Quick Evaluation

## 2021-04-14 NOTE — Anesthesia Postprocedure Evaluation (Signed)
Anesthesia Post Note  Patient: Claire Schmidt  Procedure(s) Performed: CESAREAN SECTION     Patient location during evaluation: PACU Anesthesia Type: Epidural Level of consciousness: awake and alert Pain management: pain level controlled Vital Signs Assessment: post-procedure vital signs reviewed and stable Respiratory status: spontaneous breathing, nonlabored ventilation and respiratory function stable Cardiovascular status: stable Postop Assessment: no headache, no backache and epidural receding Anesthetic complications: no   No notable events documented.  Last Vitals:  Vitals:   04/14/21 2010 04/14/21 2015  BP:    Pulse: 79 74  Resp: (!) 21 15  Temp:    SpO2: 99% 99%    Last Pain:  Vitals:   04/14/21 2010  TempSrc:   PainSc: 5    Pain Goal:                   Claire Schmidt

## 2021-04-14 NOTE — Progress Notes (Signed)
Desires  VBAC. Await L&D room. No staff available Tracing baseline 140 (+) accel Irreg  IMP: SROM Term gestation Previous LTCS GBS cx (+) IV PCN infusing P) pitocin augmentation. Epidural when L&D arrival

## 2021-04-14 NOTE — MAU Note (Signed)
Pt still await a bed on L&D  Notes some ctx  Declines pain med  VE 4/60/-3 Tracing: baseline 140 (+) accel to 155 Ctx q 2-8 mins  IMP: latent phase  Term  Previous C/S desires VBAC (+) GBS P) await L&D bed

## 2021-04-14 NOTE — MAU Provider Note (Signed)
Pt informed that the ultrasound is considered a limited OB ultrasound and is not intended to be a complete ultrasound exam.  Patient also informed that the ultrasound is not being completed with the intent of assessing for fetal or placental anomalies or any pelvic abnormalities.  Explained that the purpose of today's ultrasound is to assess for presentation Patient acknowledges the purpose of the exam and the limitations of the study.    Fetus is in longitudinal lie Vertex presentation

## 2021-04-14 NOTE — Brief Op Note (Signed)
04/14/2021  7:59 PM  PATIENT:  Claire Schmidt  25 y.o. female  PRE-OPERATIVE DIAGNOSIS:  fetal intolerance to labor, failed TOLAC, GBS cx positive, term gestation  POST-OPERATIVE DIAGNOSIS:  same, LOP presentation  PROCEDURE:  emergency repeat cesarean section, kerr hysterotomy  SURGEON:  Surgeon(s) and Role:    * Anthon Harpole, MD - Primary  PHYSICIAN ASSISTANT:   ASSISTANTS: none   ANESTHESIA:   epidural FINDINGS; LIVE FEMALE LOP nl tubes bilateral PCOS, apgars 8/9 EBL:  365 ml  BLOOD ADMINISTERED:none  DRAINS: none   LOCAL MEDICATIONS USED:  MARCAINE     SPECIMEN:  Source of Specimen:  placenta  DISPOSITION OF SPECIMEN:  PATHOLOGY  COUNTS:  YES  TOURNIQUET:  * No tourniquets in log *  DICTATION: .Other Dictation: Dictation Number [id number]  PLAN OF CARE: Admit to inpatient   PATIENT DISPOSITION:  PACU - hemodynamically stable.   Delay start of Pharmacological VTE agent (>24hrs) due to surgical blood loss or risk of bleeding: no

## 2021-04-14 NOTE — Anesthesia Procedure Notes (Signed)

## 2021-04-15 LAB — CBC
HCT: 30.4 % — ABNORMAL LOW (ref 36.0–46.0)
Hemoglobin: 10 g/dL — ABNORMAL LOW (ref 12.0–15.0)
MCH: 27.5 pg (ref 26.0–34.0)
MCHC: 32.9 g/dL (ref 30.0–36.0)
MCV: 83.5 fL (ref 80.0–100.0)
Platelets: 305 10*3/uL (ref 150–400)
RBC: 3.64 MIL/uL — ABNORMAL LOW (ref 3.87–5.11)
RDW: 13.7 % (ref 11.5–15.5)
WBC: 22.3 10*3/uL — ABNORMAL HIGH (ref 4.0–10.5)
nRBC: 0 % (ref 0.0–0.2)

## 2021-04-15 NOTE — Progress Notes (Signed)
SVD: repeat  S:  Pt reports feeling well/ Tolerating po/ Voiding without problems/ No n/v/ Bleeding is moderate/ Pain controlled withprescription NSAID's including ibuprofen (Motrin) and narcotic analgesics including oxycodone/acetaminophen (Percocet, Tylox)    O:  A & O x 3 / VS: Blood pressure 126/62, pulse 67, temperature 98.8 F (37.1 C), resp. rate 18, height 5' (1.524 m), weight 110.2 kg, last menstrual period 07/10/2020, SpO2 100 %.  LABS:  Results for orders placed or performed during the hospital encounter of 04/14/21 (from the past 24 hour(s))  CBC     Status: Abnormal   Collection Time: 04/15/21  5:00 AM  Result Value Ref Range   WBC 22.3 (H) 4.0 - 10.5 K/uL   RBC 3.64 (L) 3.87 - 5.11 MIL/uL   Hemoglobin 10.0 (L) 12.0 - 15.0 g/dL   HCT 78.2 (L) 95.6 - 21.3 %   MCV 83.5 80.0 - 100.0 fL   MCH 27.5 26.0 - 34.0 pg   MCHC 32.9 30.0 - 36.0 g/dL   RDW 08.6 57.8 - 46.9 %   Platelets 305 150 - 400 K/uL   nRBC 0.0 0.0 - 0.2 %    I&O: I/O last 3 completed shifts: In: 3630.8 [P.O.:300; I.V.:3330.8] Out: 2140 [Urine:1750; Blood:390]   Total I/O In: 240 [P.O.:240] Out: 600 [Urine:600]  Lungs: chest clear, no wheezing, rales, normal symmetric air entry  Heart: regular rate and rhythm, S1, S2 normal, no murmur, click, rub or gallop  Abdomen: soft obese uterus at umb.. pressure dressing in place  Perineum: not inspected  Lochia: moderate  Extremities:no redness or tenderness in the calves or thighs, edema 1+    A/P: POD # 1/PPD # 1/ G2P1001  Doing well  Continue routine post partum orders  Possible d/c in am if pt remains stable and baby ok for d/c Abdominal binder

## 2021-04-15 NOTE — Op Note (Signed)
NAME: Claire Schmidt, Claire Schmidt MEDICAL RECORD NO: [id number] ACCOUNT NO: [id number] DATE OF BIRTH: 1996/02/16 FACILITY: [facility] LOCATION: [facility] PHYSICIAN: Shandale Malak A. Cherly Hensen, MD  Operative Report   DATE OF PROCEDURE: 04/14/2021  PREOPERATIVE DIAGNOSES:  Nonreassuring fetal tracing, previous cesarean section, failed trial of labor, term gestation, group B strep positive.  POSTOPERATIVE DIAGNOSES:  Nonreassuring fetal tracing, previous cesarean section, failed trial of labor, term gestation, group B strep positive.  PROCEDURE:  Emergency repeat cesarean section, Kerr hysterotomy.  ANESTHESIA:  Epidural.  SURGEON:  Iver Fehrenbach A. Cherly Hensen, MD.  ASSISTANT:  None.  DESCRIPTION OF PROCEDURE:  Under adequate epidural anesthesia, the patient was placed in the supine position.  An indwelling Foley catheter was then placed, draining blood-tinged urine.  The patient was quickly prepped and draped.  A Pfannenstiel skin  incision was made through the prior incision, carried down to the rectus fascia.  Rectus fascia opened transversely.  The rectus fascia was then bluntly and sharply dissected off the rectus muscle in a superior and inferior fashion.  There was some  rectus diastasis.  The parietal peritoneum was noted, opened and extended.  An Alexis retractor was then placed.  The transverse incision was then made and the incision was extended in a superior and inferior fashion.  Subsequent delivery of a live  female from the left occiput posterior position was accomplished.  Cord was delayed clamped x1 minute.  Cord was then clamped, cut, and the baby was transferred to the waiting pediatrician who assigned  Apgars 8 and 9 at one and five minutes.  Placenta  was spontaneous intact, sent to pathology.  Cord pH and cord bloods were obtained.  Normal tubes were noted bilaterally, polycystic ovaries with enlarged ovaries bilaterally was noted.  The incision had an extension on the right. The incision was closed   in two layers, the first layer with 0 Monocryl running locked stitch and second layer was imbricated using 0 Monocryl suture.  Bleeding on the right resulted in a 3-0 Vicryl figure-of-eight suture placement with good hemostasis.  The patient  intraoperatively had brief hypotension, but once the blood pressure was restored to confirm that the hemostasis was true, the procedure was continued with omental adhesions that were attached to the anterior abdominal wall being lysed.  The abdomen was  irrigated, suctioned of debris.  Interceed was placed overlying the lower uterine segment in inverted T fashion.  The Alexis retractor was removed.  Additional omental adhesions were lysed.  Once those adhesions were freed, the parietal peritoneum was  closed with 2-0 Vicryl.  The rectus fascia was closed with 0 Vicryl x2.  The subcutaneous area was irrigated.  Small bleeders cauterized.  Interrupted 2-0 plain sutures were placed and the skin approximated using 4-0 Vicryl subcuticular sutures.   Steri-Strips and benzoin was placed along with a pressure dressing.  SPECIMENS:  Placenta sent to the pathology.  Cord pH was 7.06.  INTRAOPERATIVE FLUIDS:  3 liters.    URINE OUTPUT: 700 ml slight pink clearing urine.    ESTIMATED BLOOD LOSS:  365 mL   COMPLICATIONS:  None.   The patient tolerated the procedure well and was transferred to recovery room in stable condition.         PAA D: 04/14/2021 10:22:12 pm T: 04/15/2021 12:50:00 am  JOB: [id number]/ [id number]

## 2021-04-15 NOTE — Progress Notes (Signed)
Patient loss iv access at 430. Patient is tolerating po fluids and bleeding is scant.Patient is drinking  fluids. Patient had 700 fluid in foley since 2030

## 2021-04-16 ENCOUNTER — Encounter (HOSPITAL_COMMUNITY): Payer: Self-pay | Admitting: Obstetrics and Gynecology

## 2021-04-16 MED ORDER — IBUPROFEN 600 MG PO TABS: 600.0000 mg | ORAL_TABLET | Freq: Four times a day (QID) | ORAL | 11 refills | Status: DC | PRN

## 2021-04-16 MED ORDER — OXYCODONE HCL 5 MG PO TABS: 5.0000 mg | ORAL_TABLET | Freq: Four times a day (QID) | ORAL | 0 refills | Status: AC | PRN

## 2021-04-16 NOTE — Discharge Instr - Supplementary Instructions (Signed)
Remove honeycomb dressing day 5 from surgery. Remove steri-strips covering the incision.

## 2021-04-16 NOTE — Progress Notes (Signed)
SVD: repeat  S:  Pt reports feeling well/ Tolerating po/ Voiding without problems/ No n/v/ Bleeding is moderate/ Pain controlled withprescription NSAID's including ibuprofen (Motrin) and narcotic analgesics including oxycodone/acetaminophen (Percocet, Tylox)    O:  A & O x 3 / VS: Blood pressure 126/78, pulse 75, temperature 97.9 F (36.6 C), temperature source Oral, resp. rate 18, height 5' (1.524 m), weight 110.2 kg, last menstrual period 07/10/2020, SpO2 100 %.  LABS: No results found for this or any previous visit (from the past 24 hour(s)).  I&O: I/O last 3 completed shifts: In: 870.8 [P.O.:540; I.V.:330.8] Out: 2625 [Urine:2600; Blood:25]   No intake/output data recorded.  Lungs: chest clear, no wheezing, rales, normal symmetric air entry  Heart: regular rate and rhythm, S1, S2 normal, no murmur, click, rub or gallop  Abdomen: soft uterus firm @ umb  Perineum: not inspected  Lochia: moderate  Extremities:no redness or tenderness in the calves or thighs, edema tr-1+    A/P: POD # 2/PPD # 2/ G2P2  Doing well  Continue routine post partum orders  D/c home  D/c instructions reviewed F/u 6 wk

## 2021-04-16 NOTE — Discharge Instructions (Signed)
Call if temperature greater than equal to 100.4, nothing per vagina for 4-6 weeks or severe nausea vomiting, increased incisional pain , drainage or redness in the incision site, no straining with bowel movements, showers no bath °

## 2021-04-16 NOTE — Discharge Summary (Signed)
Postpartum Discharge Summary  Date of Service updated      Patient Name: Claire Schmidt DOB: [birth year] MRN: [id number]  Date of admission: 04/14/2021 Delivery date:04/14/2021  Delivering provider: Beryl Hornberger  Date of discharge: 04/16/2021  Admitting diagnosis: SROM,  previous cesarean section, GBS cx positive Intrauterine pregnancy: [redacted]w[redacted]d    Secondary diagnosis:  Active Problems:   Normal labor and delivery   Postpartum care following cesarean delivery  Additional problems: none    Discharge diagnosis: Term Pregnancy Delivered and failed TOLAC, GBS cx positive, repeat cesarean section                                               Post partum procedures: none Augmentation: Pitocin Complications: None  Hospital course: Onset of Labor With Unplanned C/S   25y.o. yo G2P1001 at 37w5das admitted in Latent Labor on 04/14/2021. Patient had a labor course significant for pitocin augmentation. The patient went for cesarean section due to Arrest of Descent and Non-Reassuring FHR. Delivery details as follows: Membrane Rupture Time/Date: 12:30 AM ,04/14/2021   Delivery Method:C-Section, Low Transverse  Details of operation can be found in separate operative note. Patient had an uncomplicated postpartum course.  She is ambulating,tolerating a regular diet, passing flatus, and urinating well.  Patient is discharged home in stable condition 04/16/2021.  Newborn Data: Birth date:04/14/2021  Birth time:5:36 PM  Gender:Female  Living status:  Apgars:8 ,9  Weight:3.38 kg   Magnesium Sulfate received: No BMZ received: No Rhophylac:N/A MMR:N/A T-DaP:Given prenatally Flu: No Transfusion:No  Physical exam  Vitals:   04/15/21 1404 04/15/21 1701 04/15/21 2033 04/16/21 0435  BP: (!) 91/46 (!) 121/58 123/80 126/78  Pulse: 91 88 75   Resp: _0 Temp: 98.8 F (37.1 C)  98.7 F (37.1 C) 97.9 F (36.6 C)  TempSrc: Oral  Oral Oral  SpO2: 99%  100% 100%  Weight:      Height:        General: alert, cooperative, and no distress Lochia: appropriate Uterine Fundus: firm Incision: pressure dressing removed. Steristrip intact. No erythema/induration or exudate DVT Evaluation: No evidence of DVT seen on physical exam. Calf/Ankle edema is present Labs: Lab Results  Component Value Date   WBC 22.3 (H) 04/15/2021   HGB 10.0 (L) 04/15/2021   HCT 30.4 (L) 04/15/2021   MCV 83.5 04/15/2021   PLT 305 04/15/2021   CMP Latest Ref Rng & Units 10/21/2019  Glucose 70 - 99 mg/dL -  BUN 4 - 21 9  Creatinine 0.5 - 1.1 0.9  Sodium 137 - 147 140  Potassium 3.4 - 5.3 4.2  Chloride 99 - 108 105  CO2 13 - 22 23(A)  Calcium 8.7 - 10.7 9.5  Total Protein 6.0 - 8.3 g/dL -  Total Bilirubin 0.2 - 1.2 mg/dL -  Alkaline Phos 25 - 125 78  AST 13 - 35 13  ALT 7 - 35 17   Edinburgh Score: Edinburgh Postnatal Depression Scale Screening Tool 04/15/2021  I have been able to laugh and see the funny side of things. 0  I have looked forward with enjoyment to things. 0  I have blamed myself unnecessarily when things went wrong. 0  I have been anxious or worried for no good reason. 0  I have felt scared or panicky for no good reason.  0  Things have been getting on top of me. 0  I have been so unhappy that I have had difficulty sleeping. 0  I have felt sad or miserable. 0  I have been so unhappy that I have been crying. 0  The thought of harming myself has occurred to me. 0  Edinburgh Postnatal Depression Scale Total 0      After visit meds:  Allergies as of 04/16/2021       Reactions   Bee Venom Anaphylaxis        Medication List     TAKE these medications    ibuprofen 600 MG tablet Commonly known as: ADVIL Take 1 tablet (600 mg total) by mouth every 6 (six) hours as needed.   magnesium gluconate 500 MG tablet Commonly known as: MAGONATE Take 500 mg by mouth 2 (two) times daily.   oxyCODONE 5 MG immediate release tablet Commonly known as: Oxy IR/ROXICODONE Take 1-2  tablets (5-10 mg total) by mouth every 6 (six) hours as needed for up to 7 days for moderate pain.   PRENATAL VITAMIN PO Take by mouth.         Discharge home in stable condition Infant Feeding: Bottle and Breast Infant Disposition:home with mother Discharge instruction: per After Visit Summary and Postpartum booklet. Activity: Advance as tolerated. Pelvic rest for 6 weeks.  Diet: routine diet Anticipated Birth Control: Nexplanon Postpartum Appointment:6 weeks Additional Postpartum F/U:  n/a Future Appointments:No future appointments. Follow up Visit:  Follow-up Information     Servando Salina, MD Follow up in 6 week(s).   Specialty: Obstetrics and Gynecology Contact information: [address] [place] [address] [phone]                     04/16/2021 Marvene Staff, MD

## 2021-04-18 LAB — SURGICAL PATHOLOGY

## 2021-04-20 ENCOUNTER — Other Ambulatory Visit (HOSPITAL_COMMUNITY): Payer: BC Managed Care – PPO

## 2021-04-20 ENCOUNTER — Encounter (HOSPITAL_COMMUNITY)
Admission: RE | Admit: 2021-04-20 | Discharge: 2021-04-20 | Disposition: A | Discharge status: Home / Self Care | Payer: BC Managed Care – PPO | Source: Ambulatory Visit | Attending: Obstetrics and Gynecology | Admitting: Obstetrics and Gynecology

## 2021-04-22 ENCOUNTER — Encounter (HOSPITAL_COMMUNITY): Admission: RE | Payer: Self-pay | Source: Home / Self Care

## 2021-04-22 ENCOUNTER — Inpatient Hospital Stay (HOSPITAL_COMMUNITY)
Admission: RE | Admit: 2021-04-22 | Payer: BC Managed Care – PPO | Source: Home / Self Care | Admitting: Obstetrics and Gynecology

## 2021-04-22 SURGERY — Surgical Case
Anesthesia: Regional

## 2021-04-27 ENCOUNTER — Telehealth (HOSPITAL_COMMUNITY): Payer: Self-pay

## 2021-04-27 NOTE — Telephone Encounter (Signed)
Discharge follow-up call, left message and call back number.

## 2021-06-07 DIAGNOSIS — F330 Major depressive disorder, recurrent, mild: Secondary | ICD-10-CM | POA: Diagnosis not present

## 2021-06-14 DIAGNOSIS — B372 Candidiasis of skin and nail: Secondary | ICD-10-CM | POA: Diagnosis not present

## 2021-06-19 DIAGNOSIS — F330 Major depressive disorder, recurrent, mild: Secondary | ICD-10-CM | POA: Diagnosis not present

## 2021-06-29 DIAGNOSIS — F330 Major depressive disorder, recurrent, mild: Secondary | ICD-10-CM | POA: Diagnosis not present

## 2021-07-03 ENCOUNTER — Emergency Department (INDEPENDENT_AMBULATORY_CARE_PROVIDER_SITE_OTHER)
Admission: EM | Admit: 2021-07-03 | Discharge: 2021-07-03 | Disposition: A | Discharge status: Home / Self Care | Payer: BC Managed Care – PPO | Source: Home / Self Care | Attending: Family Medicine | Admitting: Family Medicine

## 2021-07-03 ENCOUNTER — Other Ambulatory Visit: Payer: Self-pay

## 2021-07-03 DIAGNOSIS — J029 Acute pharyngitis, unspecified: Secondary | ICD-10-CM

## 2021-07-03 DIAGNOSIS — J0100 Acute maxillary sinusitis, unspecified: Secondary | ICD-10-CM | POA: Diagnosis not present

## 2021-07-03 MED ORDER — FLUTICASONE PROPIONATE 50 MCG/ACT NA SUSP: 2.0000 | Freq: Every day | NASAL | 0 refills | Status: DC

## 2021-07-03 MED ORDER — AMOXICILLIN 875 MG PO TABS: 875.0000 mg | ORAL_TABLET | Freq: Two times a day (BID) | ORAL | 0 refills | Status: DC

## 2021-07-03 NOTE — ED Triage Notes (Signed)
Pt presents with sore throat and congestion x1 week. Pt taking OTC mucinex.

## 2021-07-03 NOTE — Discharge Instructions (Addendum)
Continue to drink lots of water Read the humidifier if you have 1 Take Mucinex DM twice a day Add Flonase twice a day until your symptoms improve, then once a day for allergies Take the antibiotic 2 times a day for 7 days Call for questions or problems

## 2021-07-04 NOTE — ED Provider Notes (Signed)
[facility]    CSN: [id number] Arrival date & time: 07/03/21  1627      History   Chief Complaint Chief Complaint  Patient presents with   Sore Throat   Nasal Congestion    HPI Claire Schmidt is a 25 y.o. female.   HPI  Patient has had sore throat and congestion for over a week.  Purulent postnasal drip.  Scratchy sore throat.  Headache.  No body aches.  No fever or chills.  No known exposure to COVID.  Past Medical History:  Diagnosis Date   Allergic rhinitis 03/22/2014   Anemia    Atopic dermatitis 05/18/2014   H/O allergic urticaria 12/02/2014   Headache    migraines   Nexplanon in place - jan 2019 12/11/2018   Supraumbilical hernia     Patient Active Problem List   Diagnosis Date Noted   Normal labor and delivery 04/14/2021   Postpartum care following cesarean delivery 04/14/2021   Nexplanon in place - jan 2019 12/11/2018   Constipation 12/11/2018   H/O allergic urticaria 12/02/2014   Atopic dermatitis 05/18/2014   Allergic rhinitis 03/22/2014    Past Surgical History:  Procedure Laterality Date   CESAREAN SECTION N/A 08/26/2017   Procedure: CESAREAN SECTION;  Surgeon: Maxie Better, MD;  Location: [facility] BIRTHING SUIT[facility];  Service: Obstetrics;  Laterality: N/A;   CESAREAN SECTION N/A 04/14/2021   Procedure: CESAREAN SECTION;  Surgeon: Maxie Better, MD;  Location: [facility] LD ORS;  Service: Obstetrics;  Laterality: N/A;   UMBILICAL HERNIA REPAIR N/A 09/14/2019   Procedure: OPEN REPAIR INCARCERATED SUPRAUMBILICAL HERNIA;  Surgeon: Berna Bue, MD;  Location: [facility];  Service: General;  Laterality: N/A;    OB History     Gravida  2   Para  1   Term  1   Preterm      AB      Living  1      SAB      IAB      Ectopic      Multiple      Live Births  1            Home Medications    Prior to Admission medications   Medication Sig Start Date End Date Taking? Authorizing Provider   amoxicillin (AMOXIL) 875 MG tablet Take 1 tablet (875 mg total) by mouth 2 (two) times daily. 07/03/21  Yes Eustace Moore, MD  fluticasone [facility]) 50 MCG/ACT nasal spray Place 2 sprays into both nostrils daily. 07/03/21  Yes Eustace Moore, MD    Family History Family History  Problem Relation Age of Onset   Healthy Mother    Healthy Father    Dementia Maternal Grandmother    Heart attack Paternal Grandmother    Asthma Neg Hx    Diabetes Neg Hx    Heart disease Neg Hx    Hyperlipidemia Neg Hx    Hypertension Neg Hx     Social History Social History   Tobacco Use   Smoking status: Never   Smokeless tobacco: Never   Tobacco comments:    Pt states no current smokers in household  Vaping Use   Vaping Use: Never used  Substance Use Topics   Alcohol use: Not Currently   Drug use: No     Allergies   Bee venom   Review of Systems Review of Systems See HPI  Physical Exam Triage Vital Signs ED Triage Vitals  Enc Vitals Group  BP 07/03/21 1634 105/70     Pulse Rate 07/03/21 1634 77     Resp 07/03/21 1634 12     Temp 07/03/21 1634 98.6 F (37 C)     Temp Source 07/03/21 1634 Oral     SpO2 07/03/21 1634 96 %     Weight --      Height --      Head Circumference --      Peak Flow --      Pain Score 07/03/21 1638 2     Pain Loc --      Pain Edu? --      Excl. in GC? --    No data found.  Updated Vital Signs BP 105/70 (BP Location: Left Arm)   Pulse 77   Temp 98.6 F (37 C) (Oral)   Resp 12   LMP 07/10/2020   SpO2 96%      Physical Exam Constitutional:      General: She is not in acute distress.    Appearance: She is well-developed.  HENT:     Head: Normocephalic and atraumatic.     Right Ear: Tympanic membrane and ear canal normal.     Left Ear: Tympanic membrane and ear canal normal.     Nose: Congestion and rhinorrhea present.     Mouth/Throat:     Pharynx: Posterior oropharyngeal erythema present.  Eyes:     Conjunctiva/sclera:  Conjunctivae normal.     Pupils: Pupils are equal, round, and reactive to light.  Cardiovascular:     Rate and Rhythm: Normal rate and regular rhythm.     Heart sounds: Normal heart sounds.  Pulmonary:     Effort: Pulmonary effort is normal. No respiratory distress.     Breath sounds: No wheezing or rales.  Abdominal:     General: There is no distension.     Palpations: Abdomen is soft.  Musculoskeletal:        General: Normal range of motion.     Cervical back: Normal range of motion.  Lymphadenopathy:     Cervical: Cervical adenopathy present.  Skin:    General: Skin is warm and dry.  Neurological:     Mental Status: She is alert.     UC Treatments / Results  Labs (all labs ordered are listed, but only abnormal results are displayed) Labs Reviewed - No data to display  EKG   Radiology No results found.  Procedures Procedures (including critical care time)  Medications Ordered in UC Medications - No data to display  Initial Impression / Assessment and Plan / UC Course  I have reviewed the triage vital signs and the nursing notes.  Pertinent labs & imaging results that were available during my care of the patient were reviewed by me and considered in my medical decision making (see chart for details).     Likely initially viral URI, persisting for longer than a week with according to patient worsening symptoms.  We will treat with antibiotics and Flonase.  Symptomatic care.  Return as needed Final Clinical Impressions(s) / UC Diagnoses   Final diagnoses:  Acute non-recurrent maxillary sinusitis  Sore throat     Discharge Instructions      Continue to drink lots of water Read the humidifier if you have 1 Take Mucinex DM twice a day Add Flonase twice a day until your symptoms improve, then once a day for allergies Take the antibiotic 2 times a day for 7 days Call for questions or  problems   ED Prescriptions     Medication Sig Dispense Auth. Provider    fluticasone (FLONASE) 50 MCG/ACT nasal spray Place 2 sprays into both nostrils daily. 16 g Eustace Moore, MD   amoxicillin (AMOXIL) 875 MG tablet Take 1 tablet (875 mg total) by mouth 2 (two) times daily. 14 tablet Eustace Moore, MD      PDMP not reviewed this encounter.   Eustace Moore, MD 07/04/21 1740

## 2021-07-05 DIAGNOSIS — F330 Major depressive disorder, recurrent, mild: Secondary | ICD-10-CM | POA: Diagnosis not present

## 2021-07-12 DIAGNOSIS — F330 Major depressive disorder, recurrent, mild: Secondary | ICD-10-CM | POA: Diagnosis not present

## 2021-07-24 DIAGNOSIS — F330 Major depressive disorder, recurrent, mild: Secondary | ICD-10-CM | POA: Diagnosis not present

## 2021-08-02 DIAGNOSIS — F330 Major depressive disorder, recurrent, mild: Secondary | ICD-10-CM | POA: Diagnosis not present

## 2021-08-07 DIAGNOSIS — F330 Major depressive disorder, recurrent, mild: Secondary | ICD-10-CM | POA: Diagnosis not present

## 2021-08-24 DIAGNOSIS — F330 Major depressive disorder, recurrent, mild: Secondary | ICD-10-CM | POA: Diagnosis not present

## 2021-08-29 ENCOUNTER — Emergency Department (HOSPITAL_BASED_OUTPATIENT_CLINIC_OR_DEPARTMENT_OTHER): Payer: BC Managed Care – PPO

## 2021-08-29 ENCOUNTER — Encounter (HOSPITAL_BASED_OUTPATIENT_CLINIC_OR_DEPARTMENT_OTHER): Payer: Self-pay

## 2021-08-29 ENCOUNTER — Emergency Department (HOSPITAL_BASED_OUTPATIENT_CLINIC_OR_DEPARTMENT_OTHER)
Admission: EM | Admit: 2021-08-29 | Discharge: 2021-08-29 | Disposition: A | Discharge status: Home / Self Care | Payer: BC Managed Care – PPO | Attending: Emergency Medicine | Admitting: Emergency Medicine

## 2021-08-29 ENCOUNTER — Emergency Department (INDEPENDENT_AMBULATORY_CARE_PROVIDER_SITE_OTHER)
Admission: EM | Admit: 2021-08-29 | Discharge: 2021-08-29 | Disposition: A | Discharge status: Home / Self Care | Payer: BC Managed Care – PPO | Source: Home / Self Care | Attending: Family Medicine | Admitting: Family Medicine

## 2021-08-29 ENCOUNTER — Emergency Department (INDEPENDENT_AMBULATORY_CARE_PROVIDER_SITE_OTHER): Payer: BC Managed Care – PPO

## 2021-08-29 ENCOUNTER — Other Ambulatory Visit: Payer: Self-pay

## 2021-08-29 ENCOUNTER — Encounter: Payer: Self-pay | Admitting: Emergency Medicine

## 2021-08-29 DIAGNOSIS — Z20822 Contact with and (suspected) exposure to covid-19: Secondary | ICD-10-CM | POA: Insufficient documentation

## 2021-08-29 DIAGNOSIS — R197 Diarrhea, unspecified: Secondary | ICD-10-CM

## 2021-08-29 DIAGNOSIS — K8020 Calculus of gallbladder without cholecystitis without obstruction: Secondary | ICD-10-CM

## 2021-08-29 DIAGNOSIS — R1011 Right upper quadrant pain: Secondary | ICD-10-CM | POA: Insufficient documentation

## 2021-08-29 DIAGNOSIS — R1084 Generalized abdominal pain: Secondary | ICD-10-CM | POA: Insufficient documentation

## 2021-08-29 DIAGNOSIS — N898 Other specified noninflammatory disorders of vagina: Secondary | ICD-10-CM

## 2021-08-29 DIAGNOSIS — R109 Unspecified abdominal pain: Secondary | ICD-10-CM

## 2021-08-29 LAB — URINALYSIS, ROUTINE W REFLEX MICROSCOPIC
Bilirubin Urine: NEGATIVE
Glucose, UA: NEGATIVE mg/dL
Hgb urine dipstick: NEGATIVE
Ketones, ur: NEGATIVE mg/dL
Nitrite: NEGATIVE
Protein, ur: NEGATIVE mg/dL
Specific Gravity, Urine: 1.014 (ref 1.005–1.030)
pH: 5.5 (ref 5.0–8.0)

## 2021-08-29 LAB — DIFFERENTIAL
Abs Immature Granulocytes: 0.02 10*3/uL (ref 0.00–0.07)
Basophils Absolute: 0 10*3/uL (ref 0.0–0.1)
Basophils Relative: 0 %
Eosinophils Absolute: 0.2 10*3/uL (ref 0.0–0.5)
Eosinophils Relative: 2 %
Immature Granulocytes: 0 %
Lymphocytes Relative: 19 %
Lymphs Abs: 2.2 10*3/uL (ref 0.7–4.0)
Monocytes Absolute: 0.8 10*3/uL (ref 0.1–1.0)
Monocytes Relative: 7 %
Neutro Abs: 8.1 10*3/uL — ABNORMAL HIGH (ref 1.7–7.7)
Neutrophils Relative %: 72 %

## 2021-08-29 LAB — WET PREP, GENITAL
Clue Cells Wet Prep HPF POC: NONE SEEN
Sperm: NONE SEEN
Trich, Wet Prep: NONE SEEN
WBC, Wet Prep HPF POC: >10 — AB (ref <10)
Yeast Wet Prep HPF POC: NONE SEEN

## 2021-08-29 LAB — POCT URINALYSIS DIP (MANUAL ENTRY)
Bilirubin, UA: NEGATIVE
Blood, UA: NEGATIVE
Glucose, UA: NEGATIVE mg/dL
Ketones, POC UA: NEGATIVE mg/dL
Nitrite, UA: NEGATIVE
Protein Ur, POC: NEGATIVE mg/dL
Spec Grav, UA: >=1.03 — AB (ref 1.010–1.025)
Urobilinogen, UA: 0.2 E.U./dL
pH, UA: 6 (ref 5.0–8.0)

## 2021-08-29 LAB — CBC
HCT: 38.8 % (ref 36.0–46.0)
Hemoglobin: 12.5 g/dL (ref 12.0–15.0)
MCH: 26.8 pg (ref 26.0–34.0)
MCHC: 32.2 g/dL (ref 30.0–36.0)
MCV: 83.1 fL (ref 80.0–100.0)
Platelets: 331 10*3/uL (ref 150–400)
RBC: 4.67 MIL/uL (ref 3.87–5.11)
RDW: 14.7 % (ref 11.5–15.5)
WBC: 11.3 10*3/uL — ABNORMAL HIGH (ref 4.0–10.5)
nRBC: 0 % (ref 0.0–0.2)

## 2021-08-29 LAB — RESP PANEL BY RT-PCR (FLU A&B, COVID) ARPGX2
Influenza A by PCR: NEGATIVE
Influenza B by PCR: NEGATIVE
SARS Coronavirus 2 by RT PCR: NEGATIVE

## 2021-08-29 LAB — COMPREHENSIVE METABOLIC PANEL
ALT: 16 U/L (ref 0–44)
AST: 11 U/L — ABNORMAL LOW (ref 15–41)
Albumin: 4.1 g/dL (ref 3.5–5.0)
Alkaline Phosphatase: 72 U/L (ref 38–126)
Anion gap: 9 (ref 5–15)
BUN: 8 mg/dL (ref 6–20)
CO2: 25 mmol/L (ref 22–32)
Calcium: 9.6 mg/dL (ref 8.9–10.3)
Chloride: 104 mmol/L (ref 98–111)
Creatinine, Ser: 0.83 mg/dL (ref 0.44–1.00)
GFR, Estimated: >60 mL/min (ref >60)
Glucose, Bld: 89 mg/dL (ref 70–99)
Potassium: 3.6 mmol/L (ref 3.5–5.1)
Sodium: 138 mmol/L (ref 135–145)
Total Bilirubin: 0.5 mg/dL (ref 0.3–1.2)
Total Protein: 7.2 g/dL (ref 6.5–8.1)

## 2021-08-29 LAB — PREGNANCY, URINE: Preg Test, Ur: NEGATIVE

## 2021-08-29 LAB — POCT URINE PREGNANCY: Preg Test, Ur: NEGATIVE

## 2021-08-29 LAB — LIPASE, BLOOD: Lipase: 28 U/L (ref 11–51)

## 2021-08-29 MED ORDER — ONDANSETRON HCL 4 MG/2ML IJ SOLN
4.0000 mg | Freq: Once | INTRAMUSCULAR | Status: AC
  Administered 2021-08-29: 4 mg via INTRAVENOUS
  Filled 2021-08-29: qty 2

## 2021-08-29 MED ORDER — ONDANSETRON HCL 4 MG PO TABS: 4.0000 mg | ORAL_TABLET | Freq: Four times a day (QID) | ORAL | 0 refills | Status: DC

## 2021-08-29 MED ORDER — SODIUM CHLORIDE 0.9 % IV BOLUS
1000.0000 mL | Freq: Once | INTRAVENOUS | Status: AC
  Administered 2021-08-29: 1000 mL via INTRAVENOUS

## 2021-08-29 MED ORDER — HYDROCODONE-ACETAMINOPHEN 5-325 MG PO TABS: 2.0000 | ORAL_TABLET | ORAL | 0 refills | Status: DC | PRN

## 2021-08-29 MED ORDER — ONDANSETRON HCL 4 MG/2ML IJ SOLN: 4.0000 mg | Freq: Once | INTRAMUSCULAR | Status: DC

## 2021-08-29 MED ORDER — MORPHINE SULFATE (PF) 4 MG/ML IV SOLN
4.0000 mg | Freq: Once | INTRAVENOUS | Status: DC
  Filled 2021-08-29: qty 1

## 2021-08-29 MED ORDER — SODIUM CHLORIDE 0.9 % IV BOLUS: 1000.0000 mL | Freq: Once | INTRAVENOUS | Status: DC

## 2021-08-29 MED ORDER — IOHEXOL 300 MG/ML  SOLN
100.0000 mL | Freq: Once | INTRAMUSCULAR | Status: AC | PRN
  Administered 2021-08-29: 100 mL via INTRAVENOUS

## 2021-08-29 MED ORDER — DICYCLOMINE HCL 20 MG PO TABS: 20.0000 mg | ORAL_TABLET | Freq: Two times a day (BID) | ORAL | 0 refills | Status: DC

## 2021-08-29 MED ORDER — MORPHINE SULFATE (PF) 4 MG/ML IV SOLN
4.0000 mg | Freq: Once | INTRAVENOUS | Status: AC
  Administered 2021-08-29: 4 mg via INTRAVENOUS
  Filled 2021-08-29: qty 1

## 2021-08-29 NOTE — Discharge Instructions (Addendum)
You can take the Bentyl every 12 hours for the next 10 days as needed for pain.  Take the Norco sparingly for severe pain, primarily at night as it will impair your ability to drive.  Take the Norco with Zofran as it can make you nauseated and the Zofran mitigates that.  Follow-up with general surgery as needed for gallbladder removal, otherwise follow-up with your primary care doctor.  Information attached about cholelithiasis as well as the surgery if you choose to proceed with that option.  Return if you start having severe fevers, vomiting, severe pain.

## 2021-08-29 NOTE — Discharge Instructions (Signed)
Your abdominal pain is severe enough that I recommend additional testing.  You need to go to the emergency room today for blood work and possibly imaging (CAT scan) to look for the cause of your abdominal pain and diarrhea

## 2021-08-29 NOTE — ED Triage Notes (Addendum)
Diarrhea x 6 days, abdominal cramping started this morning. Watery vaginal discharge, and watery stools Vaccinated for Covid No Flu

## 2021-08-29 NOTE — ED Triage Notes (Addendum)
Pt presents w/diarrhea x6 days, abd pain x2 days and watery vaginal discharge started yesterday. Denies any urinary concerns

## 2021-08-29 NOTE — ED Provider Notes (Signed)
[facility] EMERGENCY DEPT Provider Note   CSN: [id number] Arrival date & time: 08/29/21  1636     History Chief Complaint  Patient presents with   Abdominal Pain    Claire Schmidt is a 25 y.o. female.  HPI  Patient presents with abdominal pain.  This been happening intermittently for the last 5 or 6 days, became constant 2 days ago.  It is diffuse across her abdomen, primarily in the right upper quadrant.  Unable to identify any provoking features, not worsened after she eats.  Radiates all over her abdomen, associated with nausea but no vomiting.  She also been having multiple episodes of diarrhea.  No recent antibiotics, no new restaurants or foods.  Also endorses watery vaginal discharge that started yesterday, its copious in quantity and different than her normal discharge.  Denies any burning with urination, vaginal bleeding, pelvic pain, history of STDs, new sexual partners.  No history of IBD, denies any blood in the stool.  Status post C-section and hernia repair.  Past Medical History:  Diagnosis Date   Allergic rhinitis 03/22/2014   Anemia    Atopic dermatitis 05/18/2014   H/O allergic urticaria 12/02/2014   Headache    migraines   Nexplanon in place - jan 2019 12/11/2018   Supraumbilical hernia     Patient Active Problem List   Diagnosis Date Noted   Normal labor and delivery 04/14/2021   Postpartum care following cesarean delivery 04/14/2021   Nexplanon in place - jan 2019 12/11/2018   Constipation 12/11/2018   H/O allergic urticaria 12/02/2014   Atopic dermatitis 05/18/2014   Allergic rhinitis 03/22/2014    Past Surgical History:  Procedure Laterality Date   CESAREAN SECTION N/A 08/26/2017   Procedure: CESAREAN SECTION;  Surgeon: Maxie Better, MD;  Location: [facility] BIRTHING SUIT[facility];  Service: Obstetrics;  Laterality: N/A;   CESAREAN SECTION N/A 04/14/2021   Procedure: CESAREAN SECTION;  Surgeon: Maxie Better, MD;  Location: [facility]  LD ORS;  Service: Obstetrics;  Laterality: N/A;   UMBILICAL HERNIA REPAIR N/A 09/14/2019   Procedure: OPEN REPAIR INCARCERATED SUPRAUMBILICAL HERNIA;  Surgeon: Berna Bue, MD;  Location: [facility];  Service: General;  Laterality: N/A;     OB History     Gravida  2   Para  1   Term  1   Preterm      AB      Living  1      SAB      IAB      Ectopic      Multiple      Live Births  1           Family History  Problem Relation Age of Onset   Healthy Mother    Healthy Father    Dementia Maternal Grandmother    Heart attack Paternal Grandmother    Asthma Neg Hx    Diabetes Neg Hx    Heart disease Neg Hx    Hyperlipidemia Neg Hx    Hypertension Neg Hx     Social History   Tobacco Use   Smoking status: Never   Smokeless tobacco: Never   Tobacco comments:    Pt states no current smokers in household  Vaping Use   Vaping Use: Never used  Substance Use Topics   Alcohol use: Yes   Drug use: No    Home Medications Prior to Admission medications   Not on File    Allergies  Bee venom  Review of Systems   Review of Systems  Constitutional:  Negative for fever.  HENT:  Negative for congestion.   Eyes:  Negative for photophobia.  Respiratory:  Negative for shortness of breath.   Cardiovascular:  Negative for chest pain.  Gastrointestinal:  Positive for abdominal pain, diarrhea and nausea. Negative for blood in stool and vomiting.  Genitourinary:  Positive for vaginal discharge. Negative for decreased urine volume, hematuria, vaginal bleeding and vaginal pain.  Musculoskeletal:  Negative for back pain.  Neurological:  Negative for dizziness and numbness.   Physical Exam Updated Vital Signs BP 119/71 (BP Location: Right Arm)   Pulse 97   Temp 100.1 F (37.8 C) (Oral)   Resp 18   SpO2 99%   Physical Exam Vitals and nursing note reviewed. Exam conducted with a chaperone present.  Constitutional:      General: She is not  in acute distress.    Appearance: Normal appearance. She is well-developed.  HENT:     Head: Normocephalic and atraumatic.  Eyes:     General: No scleral icterus.    Extraocular Movements: Extraocular movements intact.     Conjunctiva/sclera: Conjunctivae normal.     Pupils: Pupils are equal, round, and reactive to light.  Cardiovascular:     Rate and Rhythm: Normal rate and regular rhythm.     Heart sounds: No murmur heard. Pulmonary:     Effort: Pulmonary effort is normal. No respiratory distress.     Breath sounds: Normal breath sounds.  Abdominal:     Palpations: Abdomen is soft.     Tenderness: There is generalized abdominal tenderness and tenderness in the right upper quadrant. There is no guarding or rebound. Negative signs include Murphy's sign.     Comments: Abdomen is soft, no rigidity or guarding.  There is diffuse abdominal tenderness, worse in the right upper quadrant.  No Murphy sign.  Genitourinary:    Comments: Pelvic exam performed with female chaperone in room.  There is some scant watery vaginal discharge noted.  No cervical wall motion tenderness, no adnexal tenderness. Musculoskeletal:        General: No swelling.     Cervical back: Neck supple.  Skin:    General: Skin is warm and dry.     Capillary Refill: Capillary refill takes less than 2 seconds.     Coloration: Skin is not jaundiced.  Neurological:     Mental Status: She is alert. Mental status is at baseline.     Coordination: Coordination normal.  Psychiatric:        Mood and Affect: Mood normal.   ED Results / Procedures / Treatments   Labs (all labs ordered are listed, but only abnormal results are displayed) Labs Reviewed  WET PREP, GENITAL  LIPASE, BLOOD  COMPREHENSIVE METABOLIC PANEL  CBC  URINALYSIS, ROUTINE W REFLEX MICROSCOPIC  PREGNANCY, URINE  DIFFERENTIAL  GC/CHLAMYDIA PROBE AMP ([place]) NOT AT [facility]    EKG None  Radiology DG Abdomen 1 View  Result Date:  08/29/2021 CLINICAL DATA:  Pain EXAM: ABDOMEN - 1 VIEW COMPARISON:  None FINDINGS: Lung bases clear. Nonobstructive bowel gas pattern. No bowel dilatation or bowel wall thickening. Osseous structures unremarkable. No urinary tract calcification. IMPRESSION: Normal exam. Electronically Signed   By: Lavonia Dana M.D.   On: 08/29/2021 15:59    Procedures Procedures   Medications Ordered in ED Medications  sodium chloride 0.9 % bolus 1,000 mL (has no administration in time range)  morphine  4 MG/ML injection 4 mg (has no administration in time range)  ondansetron (ZOFRAN) injection 4 mg (has no administration in time range)  morphine 4 MG/ML injection 4 mg (has no administration in time range)    ED Course  I have reviewed the triage vital signs and the nursing notes.  Pertinent labs & imaging results that were available during my care of the patient were reviewed by me and considered in my medical decision making (see chart for details).    MDM Rules/Calculators/A&P                           Patient presented with fever, elevated heart rate but not tachycardic.  Physical exam showed diffuse tenderness with primary tenderness to the right upper quadrant.  Suspect possible cholecystitis, colitis is also an option given the diarrhea.   No evidence of PID based on my physical exam.  No adnexal tenderness, doubt ovarian process.  STD testing obtained, currently pending.  UA negative for UTI, not pyelonephritis.  Lipase negative, doubt pancreatitis.  No gross electrolyte derangement, no significant AKI or LFT elevation or pattern consistent with cholestasis static or hepatic injury.  Leukocytosis with a left shift, patient is also elevated temperature 100.1 likely febrile.  CT is finding of cholecysto lithiasis without overt findings of cholecystitis, will get ultrasound to evaluate for cholecystitis given the leukocytosis and elevated temperature.  Ultrasound negative for Coley cystitis, consider  with cholelithiasis.  Patient reports her pain is improved, discussed the results with her.  Patient expressed interest in conservative management and return precautions.  Given her information for general surgery in the outpatient setting.  Do not see any reason for additional emergent work-up at this time.  Patient discharged in stable condition.  Final Clinical Impression(s) / ED Diagnoses Final diagnoses:  None    Rx / DC Orders ED Discharge Orders     None        Sherrill Raring, PA-C 08/29/21 2009    Curatolo, Adam, DO 08/29/21 2051

## 2021-08-29 NOTE — ED Provider Notes (Signed)
[name]    CSN: [id number] Arrival date & time: 08/29/21  1452      History   Chief Complaint Chief Complaint  Patient presents with   Diarrhea    HPI Claire Schmidt is a 25 y.o. female.   HPI  Patient has diarrhea that is been progressive over the last 6 days.  Today developed abdominal pain.  She can hardly stand up straight.  The abdominal pain is severe.  She states that she has had decreased appetite but no nausea or vomiting.  No fever.  She states that she has watery bowels with mucus in the bowels but no blood.  No known recent antibiotic use.  No known colon disorder or IBS.  No known food that may have been spoiled.  She is lactose intolerance.  She is careful about not ingesting lactose, milk, butter, cheese.  Past Medical History:  Diagnosis Date   Allergic rhinitis 03/22/2014   Anemia    Atopic dermatitis 05/18/2014   H/O allergic urticaria 12/02/2014   Headache    migraines   Nexplanon in place - jan 2019 [id number]   Supraumbilical hernia     Patient Active Problem List   Diagnosis Date Noted   Normal labor and delivery 04/14/2021   Postpartum care following cesarean delivery 04/14/2021   Nexplanon in place - jan 2019 12/11/2018   Constipation 12/11/2018   H/O allergic urticaria 12/02/2014   Atopic dermatitis 05/18/2014   Allergic rhinitis 03/22/2014    Past Surgical History:  Procedure Laterality Date   CESAREAN SECTION N/A 08/26/2017   Procedure: CESAREAN SECTION;  Surgeon: Servando Salina, MD;  Location: [place];  Service: Obstetrics;  Laterality: N/A;   CESAREAN SECTION N/A 04/14/2021   Procedure: CESAREAN SECTION;  Surgeon: Servando Salina, MD;  Location: [facility] LD ORS;  Service: Obstetrics;  Laterality: N/A;   UMBILICAL HERNIA REPAIR N/A 09/14/2019   Procedure: OPEN REPAIR INCARCERATED SUPRAUMBILICAL HERNIA;  Surgeon: Clovis Riley, MD;  Location: [place];  Service: General;  Laterality:  N/A;    OB History     Gravida  2   Para  1   Term  1   Preterm      AB      Living  1      SAB      IAB      Ectopic      Multiple      Live Births  1            Home Medications    Prior to Admission medications   Not on File    Family History Family History  Problem Relation Age of Onset   Healthy Mother    Healthy Father    Dementia Maternal Grandmother    Heart attack Paternal Grandmother    Asthma Neg Hx    Diabetes Neg Hx    Heart disease Neg Hx    Hyperlipidemia Neg Hx    Hypertension Neg Hx     Social History Social History   Tobacco Use   Smoking status: Never   Smokeless tobacco: Never   Tobacco comments:    Pt states no current smokers in household  Vaping Use   Vaping Use: Never used  Substance Use Topics   Alcohol use: Yes   Drug use: No     Allergies   Bee venom   Review of Systems Review of Systems  See HPI Physical Exam Triage Vital Signs  ED Triage Vitals  Enc Vitals Group     BP 08/29/21 1507 110/74     Pulse Rate 08/29/21 1507 (!) 109     Resp 08/29/21 1507 19     Temp 08/29/21 1507 98.5 F (36.9 C)     Temp Source 08/29/21 1507 Oral     SpO2 08/29/21 1507 98 %     Weight 08/29/21 1508 226 lb (102.5 kg)     Height 08/29/21 1508 5' (1.524 m)     Head Circumference --      Peak Flow --      Pain Score 08/29/21 1507 10     Pain Loc --      Pain Edu? --      Excl. in [place]? --    No data found.  Updated Vital Signs BP 110/74 (BP Location: Left Arm)   Pulse (!) 109   Temp 98.5 F (36.9 C) (Oral)   Resp 19   Ht 5' (1.524 m)   Wt 102.5 kg   SpO2 98%   Breastfeeding No   BMI 44.14 kg/m      Physical Exam Constitutional:      General: She is in acute distress.     Appearance: She is well-developed.     Comments: Appears uncomfortable.  Guarded movements  HENT:     Head: Normocephalic and atraumatic.     Nose:     Comments: Mask is in place Eyes:     Conjunctiva/sclera: Conjunctivae  normal.     Pupils: Pupils are equal, round, and reactive to light.  Cardiovascular:     Rate and Rhythm: Normal rate and regular rhythm.     Heart sounds: Normal heart sounds.  Pulmonary:     Effort: Pulmonary effort is normal. No respiratory distress.     Breath sounds: Normal breath sounds.  Abdominal:     General: Bowel sounds are normal. There is no distension.     Palpations: Abdomen is soft.     Tenderness: There is abdominal tenderness. There is guarding and rebound.     Comments: Tenderness throughout abdomen.  There is guarding.  Rebound.  No mass.  No organomegaly.  Musculoskeletal:        General: Normal range of motion.     Cervical back: Normal range of motion and neck supple.  Skin:    General: Skin is warm and dry.  Neurological:     Mental Status: She is alert.     Gait: Gait abnormal.  Psychiatric:        Mood and Affect: Mood normal.        Behavior: Behavior normal.     UC Treatments / Results  Labs (all labs ordered are listed, but only abnormal results are displayed) Labs Reviewed  POCT URINALYSIS DIP (MANUAL ENTRY) - Abnormal; Notable for the following components:      Result Value   Spec Grav, UA >=1.030 (*)    Leukocytes, UA Trace (*)    All other components within normal limits  POCT URINE PREGNANCY  CERVICOVAGINAL ANCILLARY ONLY    EKG   Radiology DG Abdomen 1 View  Result Date: 08/29/2021 CLINICAL DATA:  Pain EXAM: ABDOMEN - 1 VIEW COMPARISON:  None FINDINGS: Lung bases clear. Nonobstructive bowel gas pattern. No bowel dilatation or bowel wall thickening. Osseous structures unremarkable. No urinary tract calcification. IMPRESSION: Normal exam. Electronically Signed   By: Lavonia Dana M.D.   On: 08/29/2021 15:59    Procedures Procedures (  including critical care time)  Medications Ordered in UC Medications - No data to display  Initial Impression / Assessment and Plan / UC Course  I have reviewed the triage vital signs and the nursing  notes.  Pertinent labs & imaging results that were available during my care of the patient were reviewed by me and considered in my medical decision making (see chart for details).     Normal UA.  Negative pregnancy.  Normal x-rays.  This is the extent of what I can offer her at this office in the evening.  I believe she needs to go to the emergency room for additional evaluation.  Patient states that she is stable to drive.  She drove here.  We made sure that she knows where to go.  Cervical vaginal swab was obtained and is sent for testing Final Clinical Impressions(s) / UC Diagnoses   Final diagnoses:  Vaginal discharge  Acute abdominal pain  Diarrhea in adult patient     Discharge Instructions      Your abdominal pain is severe enough that I recommend additional testing.  You need to go to the emergency room today for blood work and possibly imaging (CAT scan) to look for the cause of your abdominal pain and diarrhea     ED Prescriptions   None    PDMP not reviewed this encounter.   Eustace Moore, MD 08/29/21 1640

## 2021-08-30 DIAGNOSIS — Z0000 Encounter for general adult medical examination without abnormal findings: Secondary | ICD-10-CM | POA: Diagnosis not present

## 2021-08-30 DIAGNOSIS — K8020 Calculus of gallbladder without cholecystitis without obstruction: Secondary | ICD-10-CM | POA: Diagnosis not present

## 2021-08-30 DIAGNOSIS — G43119 Migraine with aura, intractable, without status migrainosus: Secondary | ICD-10-CM | POA: Diagnosis not present

## 2021-08-30 DIAGNOSIS — Z131 Encounter for screening for diabetes mellitus: Secondary | ICD-10-CM | POA: Diagnosis not present

## 2021-08-30 LAB — GC/CHLAMYDIA PROBE AMP (~~LOC~~) NOT AT ARMC
Chlamydia: NEGATIVE
Comment: NEGATIVE
Comment: NORMAL
Neisseria Gonorrhea: NEGATIVE

## 2022-03-07 ENCOUNTER — Telehealth: Payer: Self-pay

## 2022-03-20 NOTE — Telephone Encounter (Signed)
Could not reach patient by phone. 

## 2022-07-15 IMAGING — US US MFM OB FOLLOW-UP
1 series · 13 of 28 positions shown · non-contrast
Comparison: none

[Series 1: us mfm ob follow-up · 44 acquisitions, 13 frames shown]
[im 2/44]
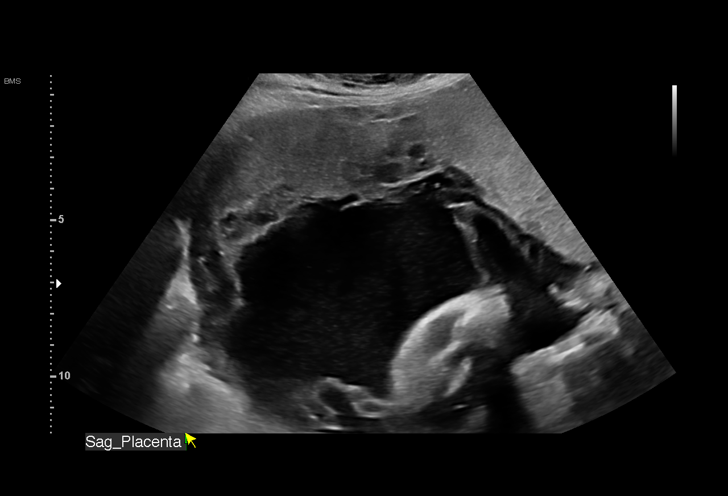
[im 5/44]
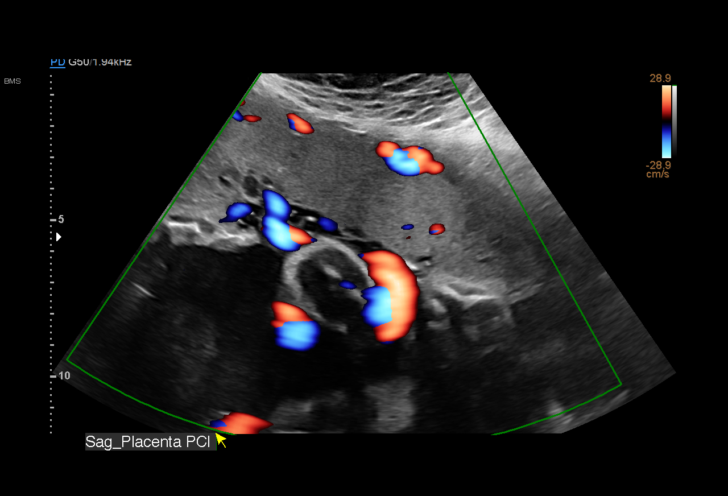
[im 8/44]
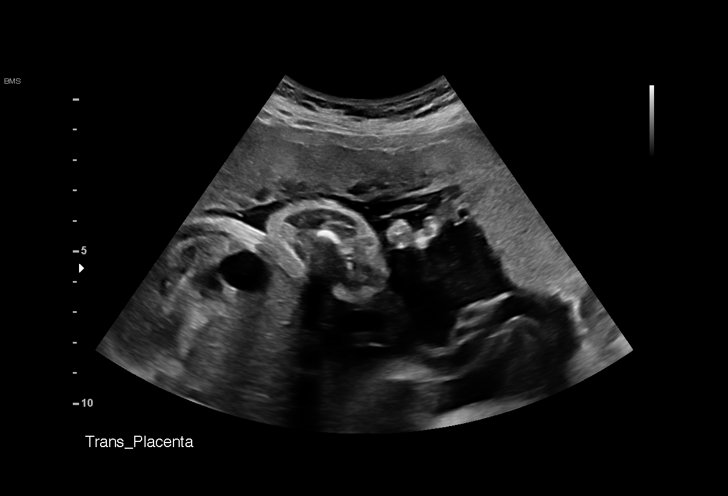
[im 12/44]
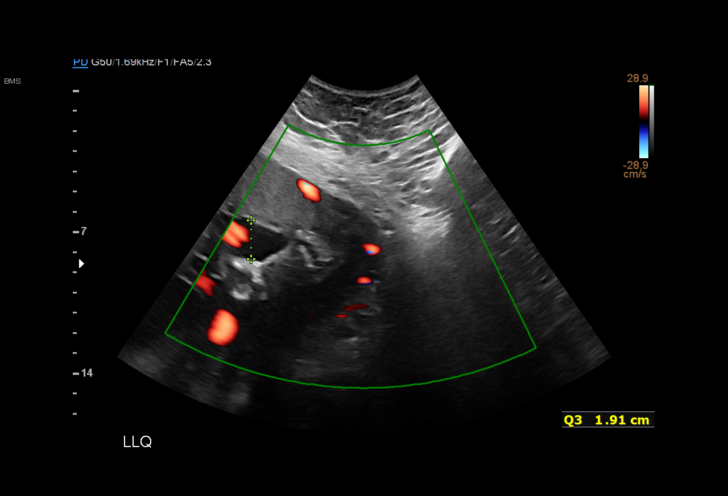
[im 15/44]
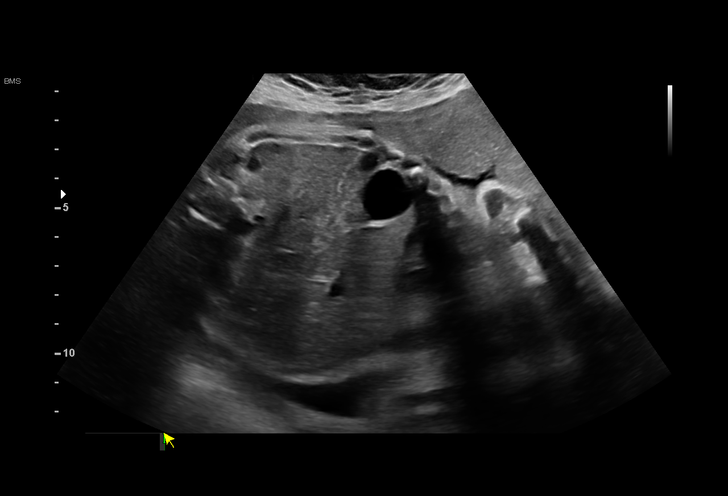
[im 18/44]
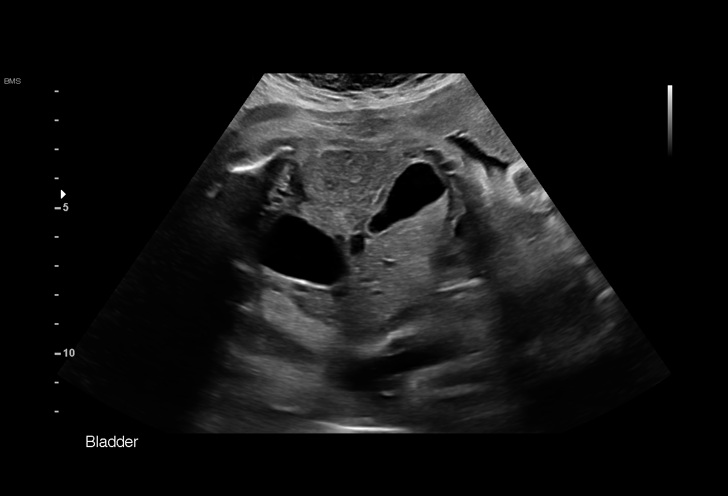
[im 23/44]
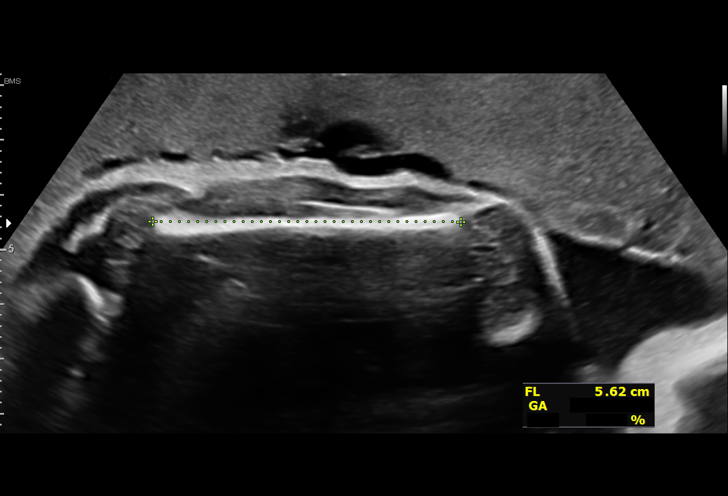
[im 26/44]
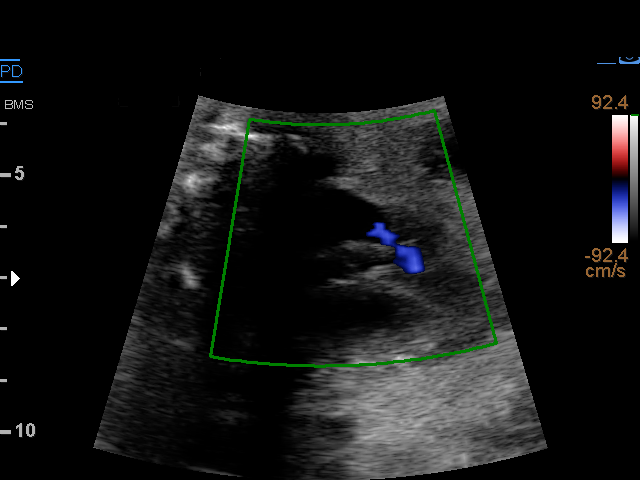
[im 29/44]
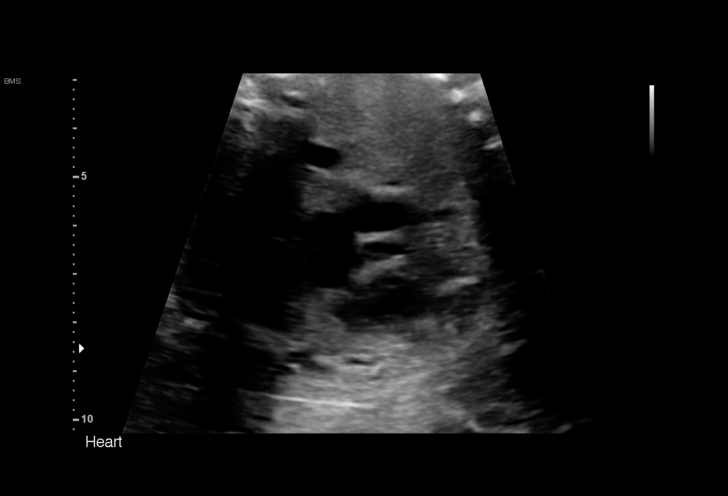
[im 32/44]
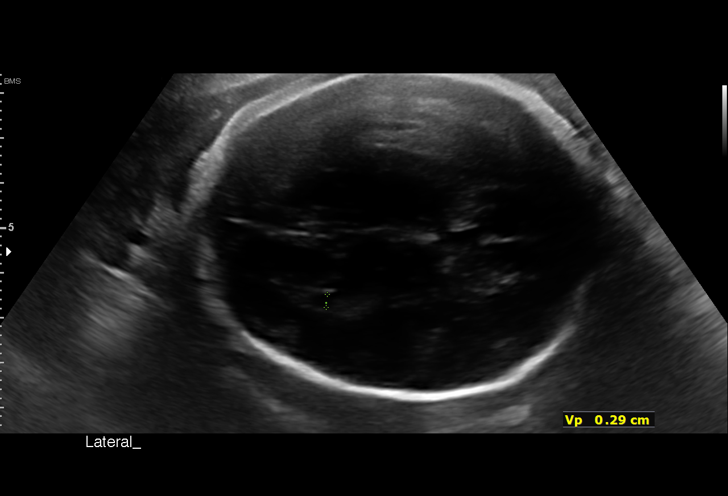
[im 36/44]
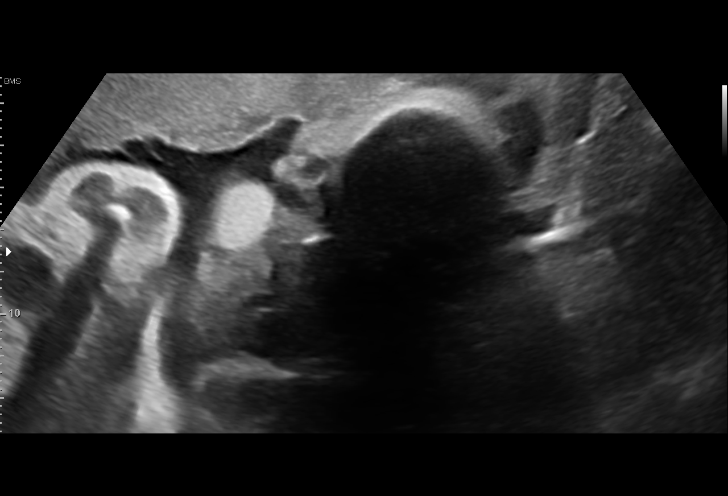
[im 39/44]
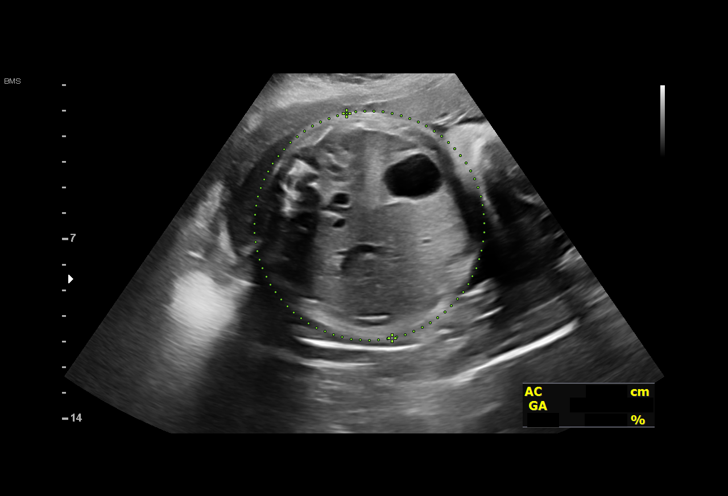
[im 42/44]
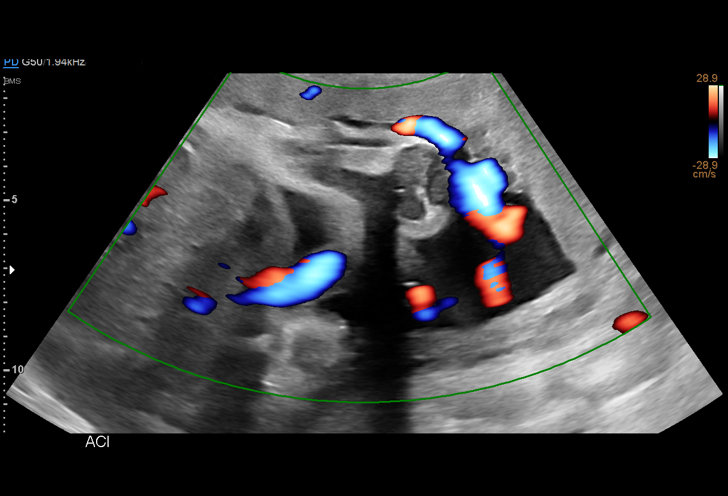

[13 of 28 positions shown; findings below may reference images not displayed]

OB/GYN

                                                      [facility]

Indications

 Other umbilical cord
 complications,antepartum condition or
 complication
 Obesity complicating pregnancy, second
 trimester
 History of cesarean delivery, currently
 pregnant x 1
 30 weeks gestation of pregnancy
 Encounter for other antenatal screening
 follow-up (declined genetic testing)
Vital Signs

                                                Height:        5'1"
Fetal Evaluation

 Num Of Fetuses:         1
 Fetal Heart Rate(bpm):  141
 Cardiac Activity:       Observed
 Presentation:           Cephalic
 Placenta:               Anterior
 P. Cord Insertion:      Visualized, central
 Amniotic Fluid
 AFI FV:      Within normal limits

 AFI Sum(cm)     %Tile       Largest Pocket(cm)


 RUQ(cm)       RLQ(cm)       LUQ(cm)        LLQ(cm)

Biometry

 BPD:      71.3  mm     G. Age:  28w 4d          6  %    CI:        68.07   %    70 - 86
                                                         FL/HC:      20.5   %    19.2 -
 HC:      276.4  mm     G. Age:  30w 2d         18  %    HC/AC:      1.00        0.99 -
 AC:      275.3  mm     G. Age:  31w 4d         85  %    FL/BPD:     79.7   %    71 - 87
 FL:       56.8  mm     G. Age:  29w 6d         26  %    FL/AC:      20.6   %    20 - 24

 LV:        2.9  mm

 Est. FW:    8889  gm      3 lb 9 oz     56  %
OB History

 Gravidity:    2         Term:   1        Prem:   0        SAB:   0
 TOP:          0       Ectopic:  0        Living: 1
Gestational Age

 LMP:           31w 4d        Date:  07/10/20                 EDD:   04/16/21
 U/S Today:     30w 1d                                        EDD:   04/26/21
 Best:          30w 1d     Det. By:  Previous Ultrasound      EDD:   04/26/21
Anatomy

 Cranium:               Previously seen        Aortic Arch:            Previously seen
 Cavum:                 Previously seen        Ductal Arch:            Previously seen
 Ventricles:            Appears normal         Diaphragm:              Appears normal
 Choroid Plexus:        Previously seen        Stomach:                Appears normal, left
                                                                       sided
 Cerebellum:            Previously seen        Abdomen:                Appears normal
 Posterior Fossa:       Previously seen        Abdominal Wall:         Appears nml (cord
                                                                       insert, abd wall)
 Nuchal Fold:           Not applicable (>20    Cord Vessels:           Previously seen
                        wks GA)
 Face:                  Orbits and profile     Kidneys:                Appear normal
                        previously seen
 Lips:                  Previously seen        Bladder:                Appears normal
 Thoracic:              Previously seen        Spine:                  Previously seen
 Heart:                 Previously seen        Upper Extremities:      Previously seen
 RVOT:                  Previously seen        Lower Extremities:      Previously seen
 LVOT:                  Previously seen

 Other:  Fetus appears to be female. Heels prev visualized. Technically
         difficult due to maternal habitus and fetal position.
Cervix Uterus Adnexa
 Cervix
 Not visualized (advanced GA >43wks)

 Uterus
 No abnormality visualized.

 Right Ovary
 Not visualized.

 Left Ovary
 Not visualized.

 Cul De Sac
 No free fluid seen.

 Adnexa
 No adnexal mass visualized.
Impression

 Follow up growth due to an umbilical cord cyst.
 Normal interval growth with measurements consistent with
 dates
 Good fetal movement and amniotic fluid volume

 I explained to Ms. Mariam that we did not visualized the
 umbilical cord cyst today. However, the risk of fetal growth
 delay's is present given its previous presence. Therefore we
 have scheduled her to return in 4-6  weeks.

 All questions answered.
Recommendations

 Follow up growth in 4-6 weeks.

## 2022-07-30 ENCOUNTER — Ambulatory Visit
Admission: EM | Admit: 2022-07-30 | Discharge: 2022-07-30 | Disposition: A | Discharge status: Home / Self Care | Payer: BC Managed Care – PPO | Attending: Family Medicine | Admitting: Family Medicine

## 2022-07-30 ENCOUNTER — Encounter: Payer: Self-pay | Admitting: Emergency Medicine

## 2022-07-30 DIAGNOSIS — J020 Streptococcal pharyngitis: Secondary | ICD-10-CM

## 2022-07-30 LAB — POCT RAPID STREP A (OFFICE): Rapid Strep A Screen: POSITIVE — AB

## 2022-07-30 MED ORDER — PENICILLIN V POTASSIUM 500 MG PO TABS
500.0000 mg | ORAL_TABLET | Freq: Two times a day (BID) | ORAL | 0 refills | Status: AC
Start: 2022-07-30 — End: 2022-08-09

## 2022-07-30 NOTE — ED Triage Notes (Signed)
Patient c/o possible strep throat.  Sore throat x 1 day, headache and fever last night.  Patient denies any OTC pain meds.

## 2022-07-30 NOTE — Discharge Instructions (Signed)
Drink lots of water Take antibiotic 2 times a day for 10 full days.  It is important to finish all of your antibiotics You may take Tylenol or ibuprofen for pain.  Sore throat spray or lozenges may help Call for problems

## 2022-07-30 NOTE — ED Provider Notes (Signed)
[facility]    CSN: [id number] Arrival date & time: 07/30/22  4496      History   Chief Complaint Chief Complaint  Patient presents with   Sore Throat    HPI Claire Schmidt is a 26 y.o. female.   HPI  Painful sore throat since yesterday.  Fever and headache.  Is worried about strep throat.  No runny nose cough or viral symptoms.  Past Medical History:  Diagnosis Date   Allergic rhinitis 03/22/2014   Anemia    Atopic dermatitis 05/18/2014   H/O allergic urticaria 12/02/2014   Headache    migraines   Nexplanon in place - jan 2019 12/11/2018   Supraumbilical hernia     Patient Active Problem List   Diagnosis Date Noted   Normal labor and delivery 04/14/2021   Postpartum care following cesarean delivery 04/14/2021   Nexplanon in place - jan 2019 12/11/2018   Constipation 12/11/2018   H/O allergic urticaria 12/02/2014   Atopic dermatitis 05/18/2014   Allergic rhinitis 03/22/2014    Past Surgical History:  Procedure Laterality Date   CESAREAN SECTION N/A 08/26/2017   Procedure: CESAREAN SECTION;  Surgeon: Maxie Better, MD;  Location: [facility];  Service: Obstetrics;  Laterality: N/A;   CESAREAN SECTION N/A 04/14/2021   Procedure: CESAREAN SECTION;  Surgeon: Maxie Better, MD;  Location: [facility];  Service: Obstetrics;  Laterality: N/A;   UMBILICAL HERNIA REPAIR N/A 09/14/2019   Procedure: OPEN REPAIR INCARCERATED SUPRAUMBILICAL HERNIA;  Surgeon: Berna Bue, MD;  Location: [facility];  Service: General;  Laterality: N/A;    OB History     Gravida  2   Para  1   Term  1   Preterm      AB      Living  1      SAB      IAB      Ectopic      Multiple      Live Births  1            Home Medications    Prior to Admission medications   Medication Sig Start Date End Date Taking? Authorizing Provider  penicillin v potassium (VEETID) 500 MG tablet Take 1 tablet (500 mg total) by mouth 2  (two) times daily for 10 days. 07/30/22 08/09/22 Yes Eustace Moore, MD    Family History Family History  Problem Relation Age of Onset   Healthy Mother    Healthy Father    Dementia Maternal Grandmother    Heart attack Paternal Grandmother    Asthma Neg Hx    Diabetes Neg Hx    Heart disease Neg Hx    Hyperlipidemia Neg Hx    Hypertension Neg Hx     Social History Social History   Tobacco Use   Smoking status: Never   Smokeless tobacco: Never   Tobacco comments:    Pt states no current smokers in household  Vaping Use   Vaping Use: Never used  Substance Use Topics   Alcohol use: Yes   Drug use: No     Allergies   Bee venom   Review of Systems Review of Systems See HPI  Physical Exam Triage Vital Signs ED Triage Vitals [07/30/22 0845]  Enc Vitals Group     BP 108/74     Pulse Rate 94     Resp 18     Temp 99 F (37.2 C)  Temp Source Oral     SpO2 98 %     Weight 215 lb (97.5 kg)     Height 5' (1.524 m)     Head Circumference      Peak Flow      Pain Score 10     Pain Loc      Pain Edu?      Excl. in [place]?    No data found.  Updated Vital Signs BP 108/74 (BP Location: Left Arm)   Pulse 94   Temp 99 F (37.2 C) (Oral)   Resp 18   Ht 5' (1.524 m)   Wt 97.5 kg   LMP 07/08/2022   SpO2 98%   BMI 41.99 kg/m     Physical Exam Constitutional:      General: She is not in acute distress.    Appearance: She is well-developed. She is obese.  HENT:     Head: Normocephalic and atraumatic.     Mouth/Throat:     Mouth: Mucous membranes are moist.     Pharynx: Uvula midline. Pharyngeal swelling and posterior oropharyngeal erythema present.     Tonsils: No tonsillar exudate. 2+ on the right. 2+ on the left.  Eyes:     Conjunctiva/sclera: Conjunctivae normal.     Pupils: Pupils are equal, round, and reactive to light.  Cardiovascular:     Rate and Rhythm: Normal rate.  Pulmonary:     Effort: Pulmonary effort is normal. No respiratory  distress.  Abdominal:     General: There is no distension.     Palpations: Abdomen is soft.  Musculoskeletal:        General: Normal range of motion.     Cervical back: Normal range of motion.  Lymphadenopathy:     Cervical: Cervical adenopathy present.  Skin:    General: Skin is warm and dry.  Neurological:     Mental Status: She is alert.      UC Treatments / Results  Labs (all labs ordered are listed, but only abnormal results are displayed) Labs Reviewed  POCT RAPID STREP A (OFFICE) - Abnormal; Notable for the following components:      Result Value   Rapid Strep A Screen Positive (*)    All other components within normal limits    EKG   Radiology No results found.  Procedures Procedures (including critical care time)  Medications Ordered in UC Medications - No data to display  Initial Impression / Assessment and Plan / UC Course  I have reviewed the triage vital signs and the nursing notes.  Pertinent labs & imaging results that were available during my care of the patient were reviewed by me and considered in my medical decision making (see chart for details).     Final Clinical Impressions(s) / UC Diagnoses   Final diagnoses:  Strep throat     Discharge Instructions      Drink lots of water Take antibiotic 2 times a day for 10 full days.  It is important to finish all of your antibiotics You may take Tylenol or ibuprofen for pain.  Sore throat spray or lozenges may help Call for problems   ED Prescriptions     Medication Sig Dispense Auth. Provider   penicillin v potassium (VEETID) 500 MG tablet Take 1 tablet (500 mg total) by mouth 2 (two) times daily for 10 days. 20 tablet Raylene Everts, MD      PDMP not reviewed this encounter.   Blanchie Serve  Fannie Knee, MD 07/30/22 [phone]

## 2022-10-09 ENCOUNTER — Ambulatory Visit
Admission: EM | Admit: 2022-10-09 | Discharge: 2022-10-09 | Disposition: A | Discharge status: Home / Self Care | Payer: Medicaid Other | Attending: Family Medicine | Admitting: Family Medicine

## 2022-10-09 DIAGNOSIS — J111 Influenza due to unidentified influenza virus with other respiratory manifestations: Secondary | ICD-10-CM

## 2022-10-09 MED ORDER — OSELTAMIVIR PHOSPHATE 75 MG PO CAPS: 75.0000 mg | ORAL_CAPSULE | Freq: Two times a day (BID) | ORAL | 0 refills | Status: AC

## 2022-10-09 NOTE — ED Triage Notes (Signed)
Pt c/o fever and cough since last night. Dad at home sick, tested pos for flu this am.

## 2022-10-09 NOTE — ED Provider Notes (Signed)
[name]    CSN: [id number] Arrival date & time: 10/09/22  1837      History   Chief Complaint Chief Complaint  Patient presents with   Fever   Cough    HPI Claire Schmidt is a 27 y.o. female.   HPI  Patient is here with fever and cough.  She also brings her 2 daughters .  The whole family is sick.  Father started a couple days ago, he went today and was tested for influenza and is positive.  Mother desires treatment  Past Medical History:  Diagnosis Date   Allergic rhinitis 03/22/2014   Anemia    Atopic dermatitis 05/18/2014   H/O allergic urticaria 12/02/2014   Headache    migraines   Nexplanon in place - jan 2019 08/01/8526   Supraumbilical hernia     Patient Active Problem List   Diagnosis Date Noted   Normal labor and delivery 04/14/2021   Postpartum care following cesarean delivery 04/14/2021   Nexplanon in place - jan 2019 12/11/2018   Constipation 12/11/2018   H/O allergic urticaria 12/02/2014   Atopic dermatitis 05/18/2014   Allergic rhinitis 03/22/2014    Past Surgical History:  Procedure Laterality Date   CESAREAN SECTION N/A 08/26/2017   Procedure: CESAREAN SECTION;  Surgeon: Servando Salina, MD;  Location: [place];  Service: Obstetrics;  Laterality: N/A;   CESAREAN SECTION N/A 04/14/2021   Procedure: CESAREAN SECTION;  Surgeon: Servando Salina, MD;  Location: [facility];  Service: Obstetrics;  Laterality: N/A;   UMBILICAL HERNIA REPAIR N/A 09/14/2019   Procedure: OPEN REPAIR INCARCERATED SUPRAUMBILICAL HERNIA;  Surgeon: Clovis Riley, MD;  Location: [place];  Service: General;  Laterality: N/A;    OB History     Gravida  2   Para  1   Term  1   Preterm      AB      Living  1      SAB      IAB      Ectopic      Multiple      Live Births  1            Home Medications    Prior to Admission medications   Medication Sig Start Date End Date Taking? Authorizing  Provider  oseltamivir (TAMIFLU) 75 MG capsule Take 1 capsule (75 mg total) by mouth every 12 (twelve) hours. 10/09/22  Yes Raylene Everts, MD    Family History Family History  Problem Relation Age of Onset   Healthy Mother    Healthy Father    Dementia Maternal Grandmother    Heart attack Paternal Grandmother    Asthma Neg Hx    Diabetes Neg Hx    Heart disease Neg Hx    Hyperlipidemia Neg Hx    Hypertension Neg Hx     Social History Social History   Tobacco Use   Smoking status: Never   Smokeless tobacco: Never   Tobacco comments:    Pt states no current smokers in household  Vaping Use   Vaping Use: Never used  Substance Use Topics   Alcohol use: Yes   Drug use: No     Allergies   Bee venom   Review of Systems Review of Systems See HPI  Physical Exam Triage Vital Signs ED Triage Vitals  Enc Vitals Group     BP 10/09/22 1919 108/76     Pulse Rate 10/09/22 1919 (!)  101     Resp 10/09/22 1919 17     Temp 10/09/22 1919 99.7 F (37.6 C)     Temp Source 10/09/22 1919 Oral     SpO2 10/09/22 1919 98 %     Weight --      Height --      Head Circumference --      Peak Flow --      Pain Score 10/09/22 1911 0     Pain Loc --      Pain Edu? --      Excl. in [place]? --    No data found.  Updated Vital Signs BP 108/76 (BP Location: Right Arm)   Pulse (!) 101   Temp 99.7 F (37.6 C) (Oral)   Resp 17   SpO2 98%   Physical Exam Constitutional:      General: She is not in acute distress.    Appearance: She is well-developed. She is ill-appearing.  HENT:     Head: Normocephalic and atraumatic.     Right Ear: Tympanic membrane normal.     Left Ear: Tympanic membrane normal.     Nose: No rhinorrhea.     Mouth/Throat:     Pharynx: No posterior oropharyngeal erythema.  Eyes:     Conjunctiva/sclera: Conjunctivae normal.     Pupils: Pupils are equal, round, and reactive to light.  Cardiovascular:     Rate and Rhythm: Tachycardia present.     Heart sounds:  Normal heart sounds.  Pulmonary:     Effort: Pulmonary effort is normal. No respiratory distress.     Breath sounds: Normal breath sounds.  Abdominal:     General: There is no distension.     Palpations: Abdomen is soft.  Musculoskeletal:        General: Normal range of motion.     Cervical back: Normal range of motion.  Skin:    General: Skin is warm and dry.  Neurological:     Mental Status: She is alert.      UC Treatments / Results  Labs (all labs ordered are listed, but only abnormal results are displayed) Labs Reviewed - No data to display  EKG   Radiology No results found.  Procedures Procedures (including critical care time)  Medications Ordered in UC Medications - No data to display  Initial Impression / Assessment and Plan / UC Course  I have reviewed the triage vital signs and the nursing notes.  Pertinent labs & imaging results that were available during my care of the patient were reviewed by me and considered in my medical decision making (see chart for details).     Treating the family for influenza.  Call for problems Final Clinical Impressions(s) / UC Diagnoses   Final diagnoses:  Influenza     Discharge Instructions      Make sure you are drinking plenty of fluids May take over-the-counter cough and cold medicine if needed Take Tamiflu 2 times a day for 5 days Call for problems    ED Prescriptions     Medication Sig Dispense Auth. Provider   oseltamivir (TAMIFLU) 75 MG capsule Take 1 capsule (75 mg total) by mouth every 12 (twelve) hours. 10 capsule Raylene Everts, MD      PDMP not reviewed this encounter.   Raylene Everts, MD 10/09/22 [phone]

## 2022-10-09 NOTE — Discharge Instructions (Signed)
Make sure you are drinking plenty of fluids May take over-the-counter cough and cold medicine if needed Take Tamiflu 2 times a day for 5 days Call for problems

## 2022-10-12 DIAGNOSIS — J111 Influenza due to unidentified influenza virus with other respiratory manifestations: Secondary | ICD-10-CM | POA: Diagnosis not present

## 2022-10-12 DIAGNOSIS — Z029 Encounter for administrative examinations, unspecified: Secondary | ICD-10-CM | POA: Diagnosis not present

## 2022-12-18 ENCOUNTER — Other Ambulatory Visit (HOSPITAL_BASED_OUTPATIENT_CLINIC_OR_DEPARTMENT_OTHER): Payer: Self-pay

## 2022-12-18 MED ORDER — NITROFURANTOIN MONOHYD MACRO 100 MG PO CAPS
100.0000 mg | ORAL_CAPSULE | Freq: Two times a day (BID) | ORAL | 0 refills | Status: AC
Start: 2022-12-18 — End: ?
  Filled 2022-12-18: qty 10, 5d supply, fill #0

## 2023-01-25 IMAGING — US US ABDOMEN LIMITED
1 series · 14 of 25 positions shown · non-contrast
Comparison: CT abdomen pelvis 08/29/2021

CLINICAL DATA: Cholelithiasis.

EXAM:
ULTRASOUND ABDOMEN LIMITED RIGHT UPPER QUADRANT

[Series 1: us abdomen limited ruq (liver/gb) · 14 of 95 slices shown]
[im 1/95]
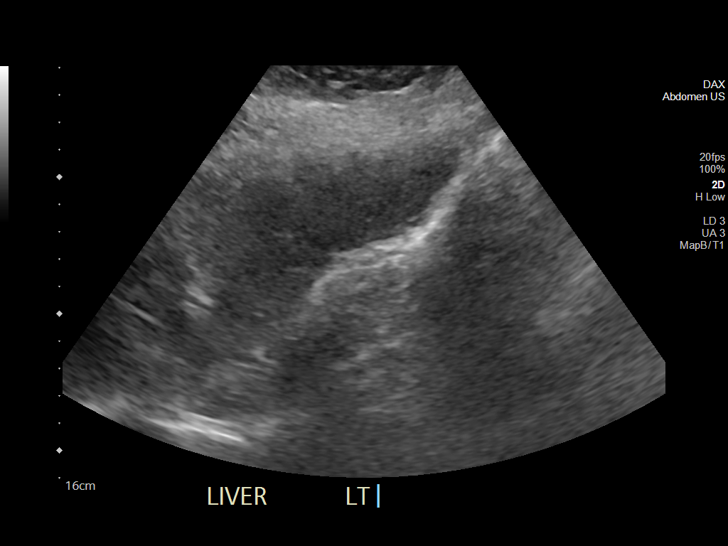
[im 8/95]
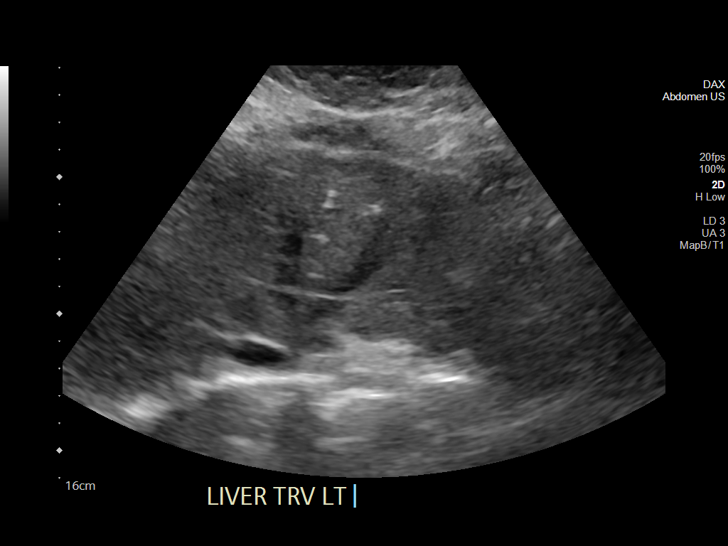
[im 16/95]
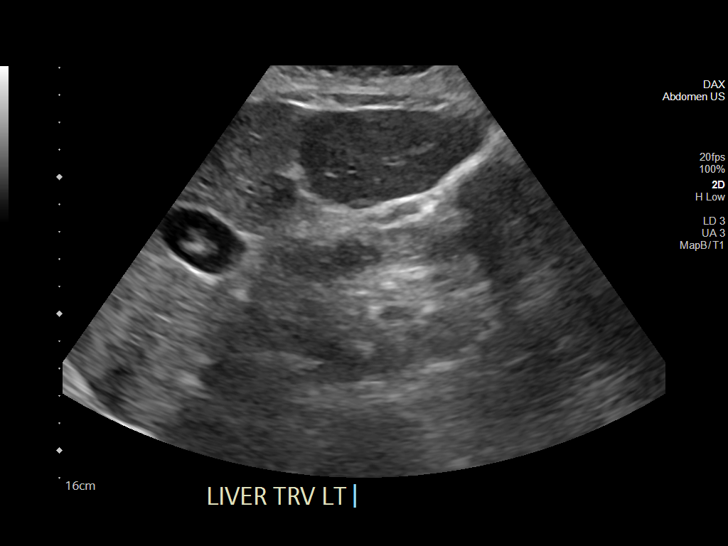
[im 24/95]
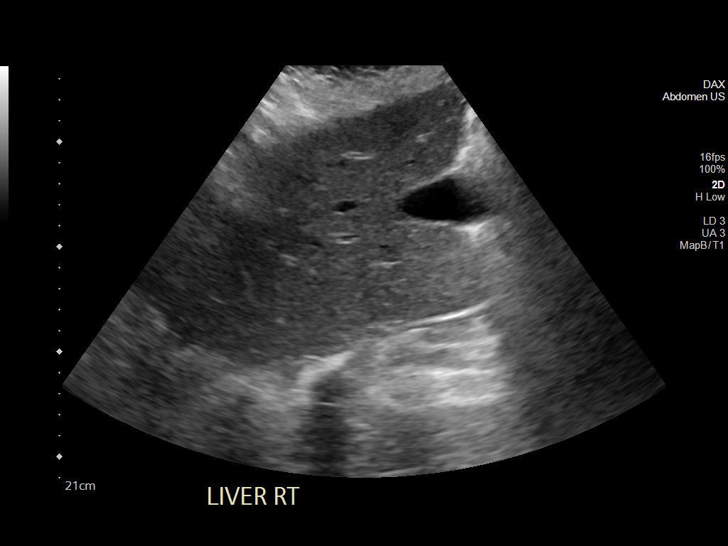
[im 32/95]
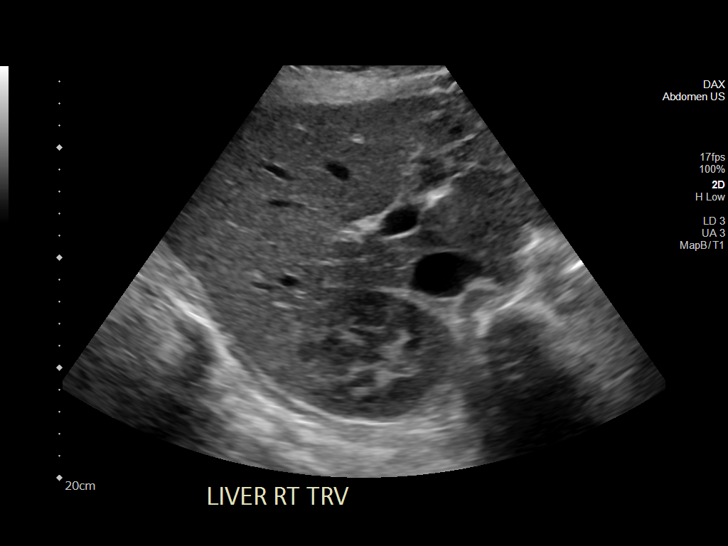
[im 36/95]
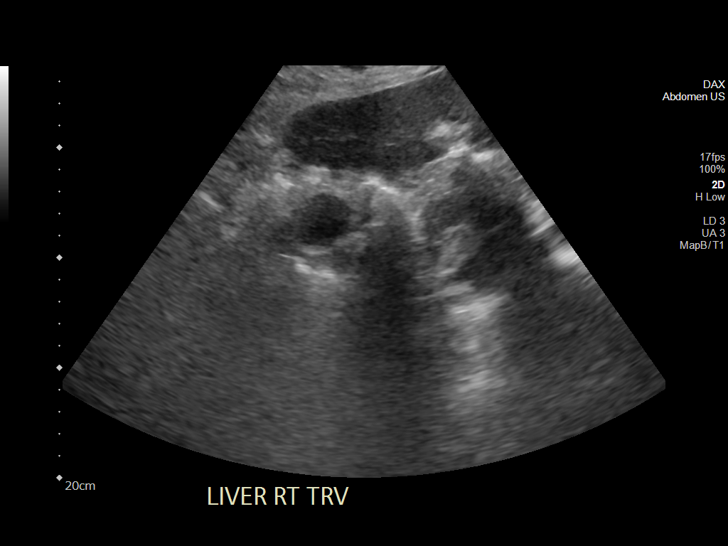
[im 44/95]
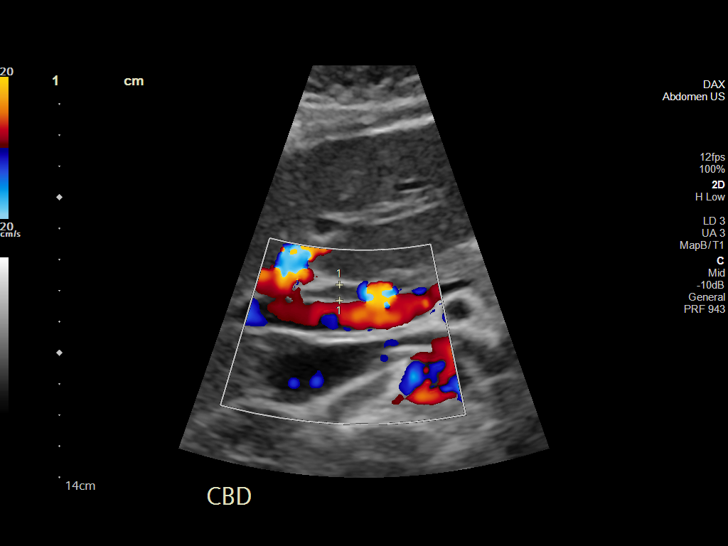
[im 51/95]
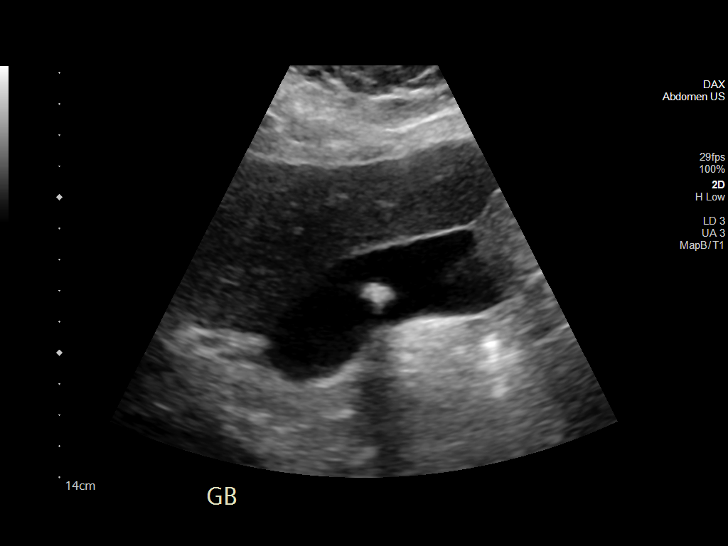
[im 59/95]
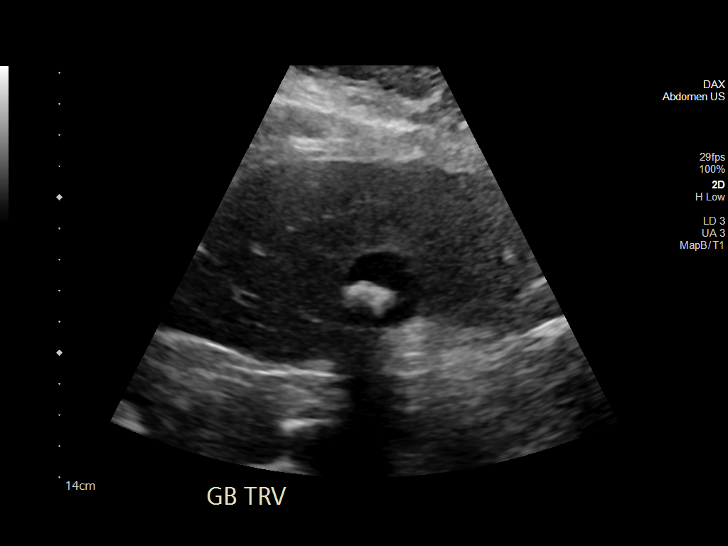
[im 63/95]
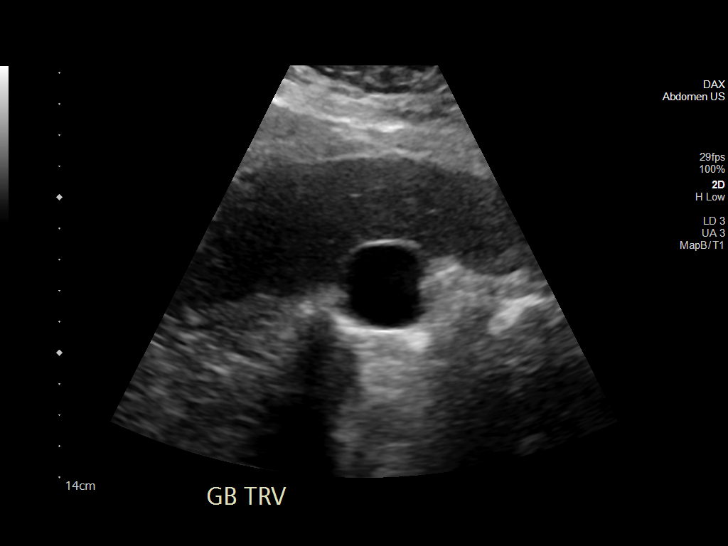
[im 71/95]
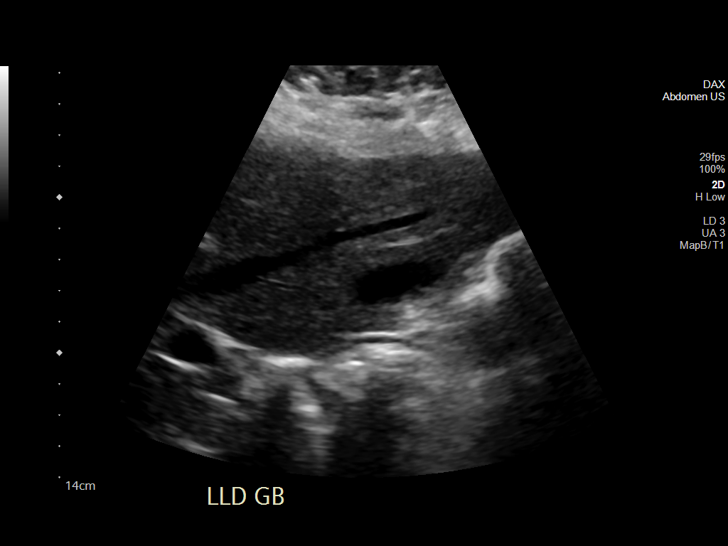
[im 79/95]
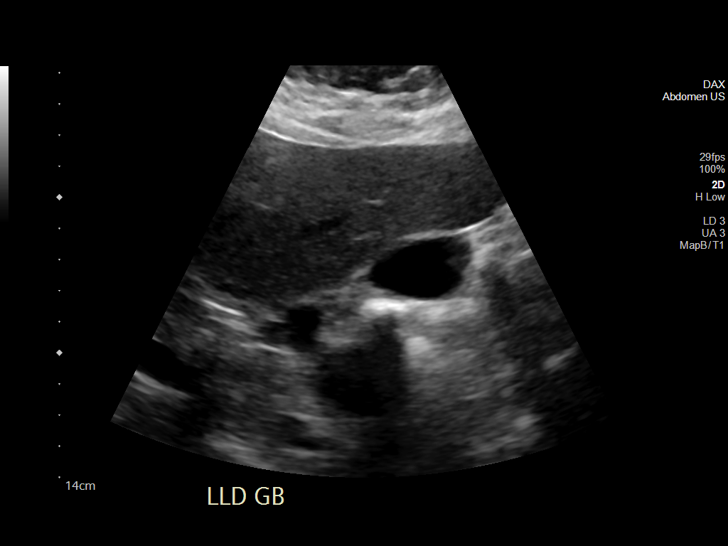
[im 87/95]
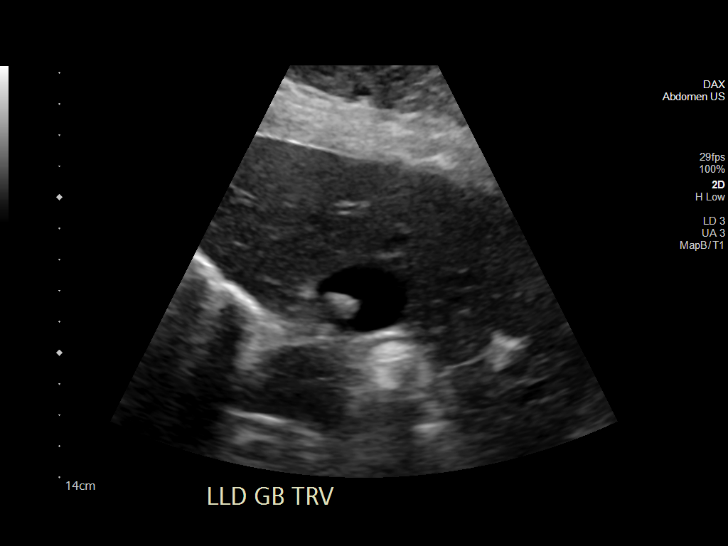
[im 95/95]
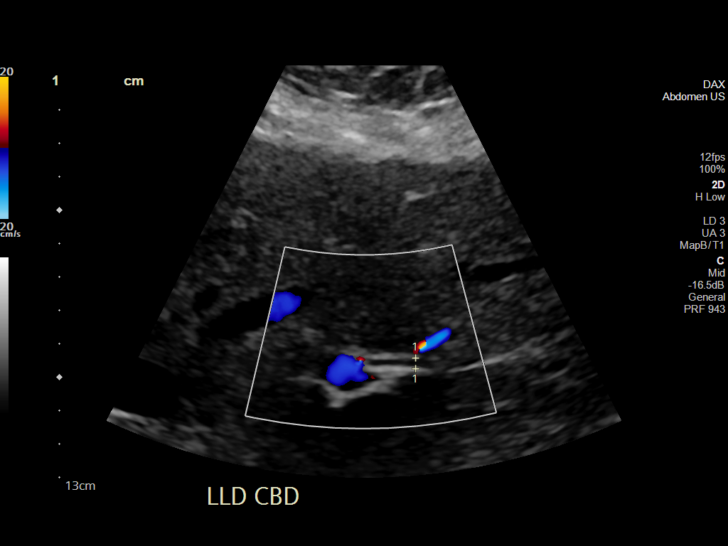

[14 of 25 positions shown; findings below may reference images not displayed]

FINDINGS: Gallbladder:

Nonmobile calcified stone within the gallbladder lumen. Wall
thickening visualized. No sonographic Murphy sign noted by
sonographer.

Common bile duct:

Diameter: 3 mm.

Liver:

No focal lesion identified. Within normal limits in parenchymal
echogenicity. Portal vein is patent on color Doppler imaging with
normal direction of blood flow towards the liver.

Other: None.
IMPRESSION: Cholelithiasis with no findings of acute cholecystitis.

## 2023-04-22 DIAGNOSIS — N644 Mastodynia: Secondary | ICD-10-CM | POA: Diagnosis not present

## 2023-04-22 DIAGNOSIS — N63 Unspecified lump in unspecified breast: Secondary | ICD-10-CM | POA: Diagnosis not present

## 2023-10-23 DIAGNOSIS — U071 COVID-19: Secondary | ICD-10-CM | POA: Diagnosis not present

## 2023-10-23 DIAGNOSIS — R051 Acute cough: Secondary | ICD-10-CM | POA: Diagnosis not present

## 2023-10-23 DIAGNOSIS — Z20822 Contact with and (suspected) exposure to covid-19: Secondary | ICD-10-CM | POA: Diagnosis not present

## 2023-10-23 DIAGNOSIS — R509 Fever, unspecified: Secondary | ICD-10-CM | POA: Diagnosis not present
